# Patient Record
Sex: Male | Born: 1940
Health system: Southern US, Community
[De-identification: ages and names within clinical notes are randomized; demographics above are authoritative.]

## PROBLEM LIST (undated history)

## (undated) DIAGNOSIS — D649 Anemia, unspecified: Secondary | ICD-10-CM

## (undated) DIAGNOSIS — N289 Disorder of kidney and ureter, unspecified: Secondary | ICD-10-CM

## (undated) DIAGNOSIS — I2510 Atherosclerotic heart disease of native coronary artery without angina pectoris: Secondary | ICD-10-CM

## (undated) DIAGNOSIS — M109 Gout, unspecified: Secondary | ICD-10-CM

## (undated) DIAGNOSIS — I359 Nonrheumatic aortic valve disorder, unspecified: Secondary | ICD-10-CM

## (undated) DIAGNOSIS — T884XXA Failed or difficult intubation, initial encounter: Secondary | ICD-10-CM

## (undated) DIAGNOSIS — I442 Atrioventricular block, complete: Secondary | ICD-10-CM

## (undated) DIAGNOSIS — E785 Hyperlipidemia, unspecified: Secondary | ICD-10-CM

## (undated) DIAGNOSIS — I10 Essential (primary) hypertension: Secondary | ICD-10-CM

## (undated) DIAGNOSIS — D1800 Hemangioma unspecified site: Secondary | ICD-10-CM

## (undated) DIAGNOSIS — N179 Acute kidney failure, unspecified: Secondary | ICD-10-CM

## (undated) DIAGNOSIS — E119 Type 2 diabetes mellitus without complications: Secondary | ICD-10-CM

## (undated) DIAGNOSIS — I350 Nonrheumatic aortic (valve) stenosis: Secondary | ICD-10-CM

## (undated) DIAGNOSIS — N189 Chronic kidney disease, unspecified: Secondary | ICD-10-CM

## (undated) DIAGNOSIS — H409 Unspecified glaucoma: Secondary | ICD-10-CM

## (undated) DIAGNOSIS — I509 Heart failure, unspecified: Secondary | ICD-10-CM

## (undated) HISTORY — DX: Acute kidney failure, unspecified: N17.9

## (undated) HISTORY — DX: Nonrheumatic aortic (valve) stenosis: I35.0

## (undated) HISTORY — DX: Essential (primary) hypertension: I10

## (undated) HISTORY — DX: Anemia, unspecified: D64.9

## (undated) HISTORY — DX: Type 2 diabetes mellitus without complications: E11.9

## (undated) HISTORY — DX: Unspecified glaucoma: H40.9

## (undated) HISTORY — DX: Atherosclerotic heart disease of native coronary artery without angina pectoris: I25.10

## (undated) HISTORY — DX: Atrioventricular block, complete: I44.2

## (undated) HISTORY — DX: Heart failure, unspecified: I50.9

## (undated) HISTORY — DX: Nonrheumatic aortic valve disorder, unspecified: I35.9

## (undated) HISTORY — DX: Chronic kidney disease, unspecified: N18.9

## (undated) HISTORY — PX: TOTAL HIP ARTHROPLASTY: SHX124

## (undated) HISTORY — DX: Hyperlipidemia, unspecified: E78.5

## (undated) HISTORY — DX: Hemangioma unspecified site: D18.00

## (undated) HISTORY — DX: Disorder of kidney and ureter, unspecified: N28.9

## (undated) HISTORY — DX: Gout, unspecified: M10.9

## (undated) HISTORY — DX: Failed or difficult intubation, initial encounter: T88.4XXA

---

## 2007-01-11 ENCOUNTER — Encounter: Admission: RE | Admit: 2007-01-11 | Discharge: 2007-01-11 | Payer: Self-pay | Admitting: Pulmonary Disease

## 2008-11-11 ENCOUNTER — Ambulatory Visit (HOSPITAL_COMMUNITY): Admission: RE | Admit: 2008-11-11 | Discharge: 2008-11-11 | Payer: Self-pay | Admitting: Pulmonary Disease

## 2009-10-07 HISTORY — PX: COLONOSCOPY: SHX174

## 2012-01-09 DIAGNOSIS — I119 Hypertensive heart disease without heart failure: Secondary | ICD-10-CM | POA: Diagnosis not present

## 2012-01-09 DIAGNOSIS — E7800 Pure hypercholesterolemia, unspecified: Secondary | ICD-10-CM | POA: Diagnosis not present

## 2012-01-09 DIAGNOSIS — E119 Type 2 diabetes mellitus without complications: Secondary | ICD-10-CM | POA: Diagnosis not present

## 2012-01-09 DIAGNOSIS — M2240 Chondromalacia patellae, unspecified knee: Secondary | ICD-10-CM | POA: Diagnosis not present

## 2012-01-09 DIAGNOSIS — Z79899 Other long term (current) drug therapy: Secondary | ICD-10-CM | POA: Diagnosis not present

## 2012-03-04 ENCOUNTER — Ambulatory Visit (HOSPITAL_COMMUNITY)
Admission: RE | Admit: 2012-03-04 | Discharge: 2012-03-04 | Disposition: A | Discharge status: Home / Self Care | Payer: Medicare Other | Source: Ambulatory Visit | Attending: Pulmonary Disease | Admitting: Pulmonary Disease

## 2012-03-04 ENCOUNTER — Other Ambulatory Visit: Payer: Self-pay | Admitting: Pulmonary Disease

## 2012-03-04 DIAGNOSIS — R05 Cough: Secondary | ICD-10-CM | POA: Insufficient documentation

## 2012-03-04 DIAGNOSIS — R0602 Shortness of breath: Secondary | ICD-10-CM | POA: Diagnosis not present

## 2012-03-04 DIAGNOSIS — R059 Cough, unspecified: Secondary | ICD-10-CM | POA: Insufficient documentation

## 2012-03-04 DIAGNOSIS — R079 Chest pain, unspecified: Secondary | ICD-10-CM | POA: Diagnosis not present

## 2012-03-05 DIAGNOSIS — I119 Hypertensive heart disease without heart failure: Secondary | ICD-10-CM | POA: Diagnosis not present

## 2012-03-05 DIAGNOSIS — J209 Acute bronchitis, unspecified: Secondary | ICD-10-CM | POA: Diagnosis not present

## 2012-03-05 DIAGNOSIS — E119 Type 2 diabetes mellitus without complications: Secondary | ICD-10-CM | POA: Diagnosis not present

## 2012-06-02 DIAGNOSIS — Z79899 Other long term (current) drug therapy: Secondary | ICD-10-CM | POA: Diagnosis not present

## 2012-06-02 DIAGNOSIS — E7800 Pure hypercholesterolemia, unspecified: Secondary | ICD-10-CM | POA: Diagnosis not present

## 2012-06-02 DIAGNOSIS — I119 Hypertensive heart disease without heart failure: Secondary | ICD-10-CM | POA: Diagnosis not present

## 2012-06-02 DIAGNOSIS — M79609 Pain in unspecified limb: Secondary | ICD-10-CM | POA: Diagnosis not present

## 2012-06-02 DIAGNOSIS — E119 Type 2 diabetes mellitus without complications: Secondary | ICD-10-CM | POA: Diagnosis not present

## 2012-06-02 DIAGNOSIS — Z96649 Presence of unspecified artificial hip joint: Secondary | ICD-10-CM | POA: Diagnosis not present

## 2012-08-24 ENCOUNTER — Other Ambulatory Visit: Payer: Self-pay | Admitting: Orthopedic Surgery

## 2012-08-24 ENCOUNTER — Ambulatory Visit
Admission: RE | Admit: 2012-08-24 | Discharge: 2012-08-24 | Disposition: A | Discharge status: Home / Self Care | Payer: Federal, State, Local not specified - PPO | Source: Ambulatory Visit | Attending: Orthopedic Surgery | Admitting: Orthopedic Surgery

## 2012-08-24 ENCOUNTER — Ambulatory Visit
Admission: RE | Admit: 2012-08-24 | Discharge: 2012-08-24 | Disposition: A | Discharge status: Home / Self Care | Payer: Medicare Other | Source: Ambulatory Visit | Attending: Orthopedic Surgery | Admitting: Orthopedic Surgery

## 2012-08-24 DIAGNOSIS — M25559 Pain in unspecified hip: Secondary | ICD-10-CM | POA: Diagnosis not present

## 2012-08-24 DIAGNOSIS — M25552 Pain in left hip: Secondary | ICD-10-CM

## 2012-08-24 DIAGNOSIS — T84498A Other mechanical complication of other internal orthopedic devices, implants and grafts, initial encounter: Secondary | ICD-10-CM | POA: Diagnosis not present

## 2012-08-31 DIAGNOSIS — T84498A Other mechanical complication of other internal orthopedic devices, implants and grafts, initial encounter: Secondary | ICD-10-CM | POA: Diagnosis not present

## 2013-01-12 DIAGNOSIS — M109 Gout, unspecified: Secondary | ICD-10-CM | POA: Diagnosis not present

## 2013-01-12 DIAGNOSIS — Z79899 Other long term (current) drug therapy: Secondary | ICD-10-CM | POA: Diagnosis not present

## 2013-01-12 DIAGNOSIS — I119 Hypertensive heart disease without heart failure: Secondary | ICD-10-CM | POA: Diagnosis not present

## 2013-01-12 DIAGNOSIS — E7800 Pure hypercholesterolemia, unspecified: Secondary | ICD-10-CM | POA: Diagnosis not present

## 2013-02-18 DIAGNOSIS — M1990 Unspecified osteoarthritis, unspecified site: Secondary | ICD-10-CM | POA: Diagnosis not present

## 2013-02-18 DIAGNOSIS — I119 Hypertensive heart disease without heart failure: Secondary | ICD-10-CM | POA: Diagnosis not present

## 2013-02-18 DIAGNOSIS — M109 Gout, unspecified: Secondary | ICD-10-CM | POA: Diagnosis not present

## 2013-02-18 DIAGNOSIS — Z79899 Other long term (current) drug therapy: Secondary | ICD-10-CM | POA: Diagnosis not present

## 2013-02-18 DIAGNOSIS — E7800 Pure hypercholesterolemia, unspecified: Secondary | ICD-10-CM | POA: Diagnosis not present

## 2013-03-08 DIAGNOSIS — R21 Rash and other nonspecific skin eruption: Secondary | ICD-10-CM | POA: Diagnosis not present

## 2013-03-08 DIAGNOSIS — IMO0001 Reserved for inherently not codable concepts without codable children: Secondary | ICD-10-CM | POA: Diagnosis not present

## 2013-07-02 DIAGNOSIS — IMO0001 Reserved for inherently not codable concepts without codable children: Secondary | ICD-10-CM | POA: Diagnosis not present

## 2013-07-02 DIAGNOSIS — Z471 Aftercare following joint replacement surgery: Secondary | ICD-10-CM | POA: Diagnosis not present

## 2013-07-05 DIAGNOSIS — Z471 Aftercare following joint replacement surgery: Secondary | ICD-10-CM | POA: Diagnosis not present

## 2013-07-05 DIAGNOSIS — IMO0001 Reserved for inherently not codable concepts without codable children: Secondary | ICD-10-CM | POA: Diagnosis not present

## 2013-07-07 DIAGNOSIS — IMO0001 Reserved for inherently not codable concepts without codable children: Secondary | ICD-10-CM | POA: Diagnosis not present

## 2013-07-07 DIAGNOSIS — Z471 Aftercare following joint replacement surgery: Secondary | ICD-10-CM | POA: Diagnosis not present

## 2013-07-09 DIAGNOSIS — IMO0001 Reserved for inherently not codable concepts without codable children: Secondary | ICD-10-CM | POA: Diagnosis not present

## 2013-07-09 DIAGNOSIS — Z471 Aftercare following joint replacement surgery: Secondary | ICD-10-CM | POA: Diagnosis not present

## 2013-07-16 DIAGNOSIS — Z471 Aftercare following joint replacement surgery: Secondary | ICD-10-CM | POA: Diagnosis not present

## 2013-07-16 DIAGNOSIS — IMO0001 Reserved for inherently not codable concepts without codable children: Secondary | ICD-10-CM | POA: Diagnosis not present

## 2013-07-17 DIAGNOSIS — Z471 Aftercare following joint replacement surgery: Secondary | ICD-10-CM | POA: Diagnosis not present

## 2013-07-17 DIAGNOSIS — IMO0001 Reserved for inherently not codable concepts without codable children: Secondary | ICD-10-CM | POA: Diagnosis not present

## 2013-07-19 DIAGNOSIS — Z471 Aftercare following joint replacement surgery: Secondary | ICD-10-CM | POA: Diagnosis not present

## 2013-07-19 DIAGNOSIS — IMO0001 Reserved for inherently not codable concepts without codable children: Secondary | ICD-10-CM | POA: Diagnosis not present

## 2013-07-22 DIAGNOSIS — Z471 Aftercare following joint replacement surgery: Secondary | ICD-10-CM | POA: Diagnosis not present

## 2013-07-22 DIAGNOSIS — IMO0001 Reserved for inherently not codable concepts without codable children: Secondary | ICD-10-CM | POA: Diagnosis not present

## 2013-07-23 DIAGNOSIS — IMO0001 Reserved for inherently not codable concepts without codable children: Secondary | ICD-10-CM | POA: Diagnosis not present

## 2013-07-23 DIAGNOSIS — Z471 Aftercare following joint replacement surgery: Secondary | ICD-10-CM | POA: Diagnosis not present

## 2013-08-14 DIAGNOSIS — R609 Edema, unspecified: Secondary | ICD-10-CM | POA: Diagnosis not present

## 2013-08-14 DIAGNOSIS — Z9889 Other specified postprocedural states: Secondary | ICD-10-CM | POA: Diagnosis not present

## 2013-08-24 DIAGNOSIS — M1990 Unspecified osteoarthritis, unspecified site: Secondary | ICD-10-CM | POA: Diagnosis not present

## 2013-08-24 DIAGNOSIS — R609 Edema, unspecified: Secondary | ICD-10-CM | POA: Diagnosis not present

## 2013-08-24 DIAGNOSIS — M109 Gout, unspecified: Secondary | ICD-10-CM | POA: Diagnosis not present

## 2013-08-24 DIAGNOSIS — E7800 Pure hypercholesterolemia, unspecified: Secondary | ICD-10-CM | POA: Diagnosis not present

## 2013-08-24 DIAGNOSIS — Z79899 Other long term (current) drug therapy: Secondary | ICD-10-CM | POA: Diagnosis not present

## 2013-08-24 DIAGNOSIS — E1129 Type 2 diabetes mellitus with other diabetic kidney complication: Secondary | ICD-10-CM | POA: Diagnosis not present

## 2013-09-11 IMAGING — CR DG HIP (WITH OR WITHOUT PELVIS) 2-3V*L*
2 series · 2 of 2 positions shown · non-contrast
Comparison: None.

CLINICAL DATA: Previous hip arthroplasty, pain.

LEFT HIP - COMPLETE 2+ VIEW

[view not recorded (1 of 2)]
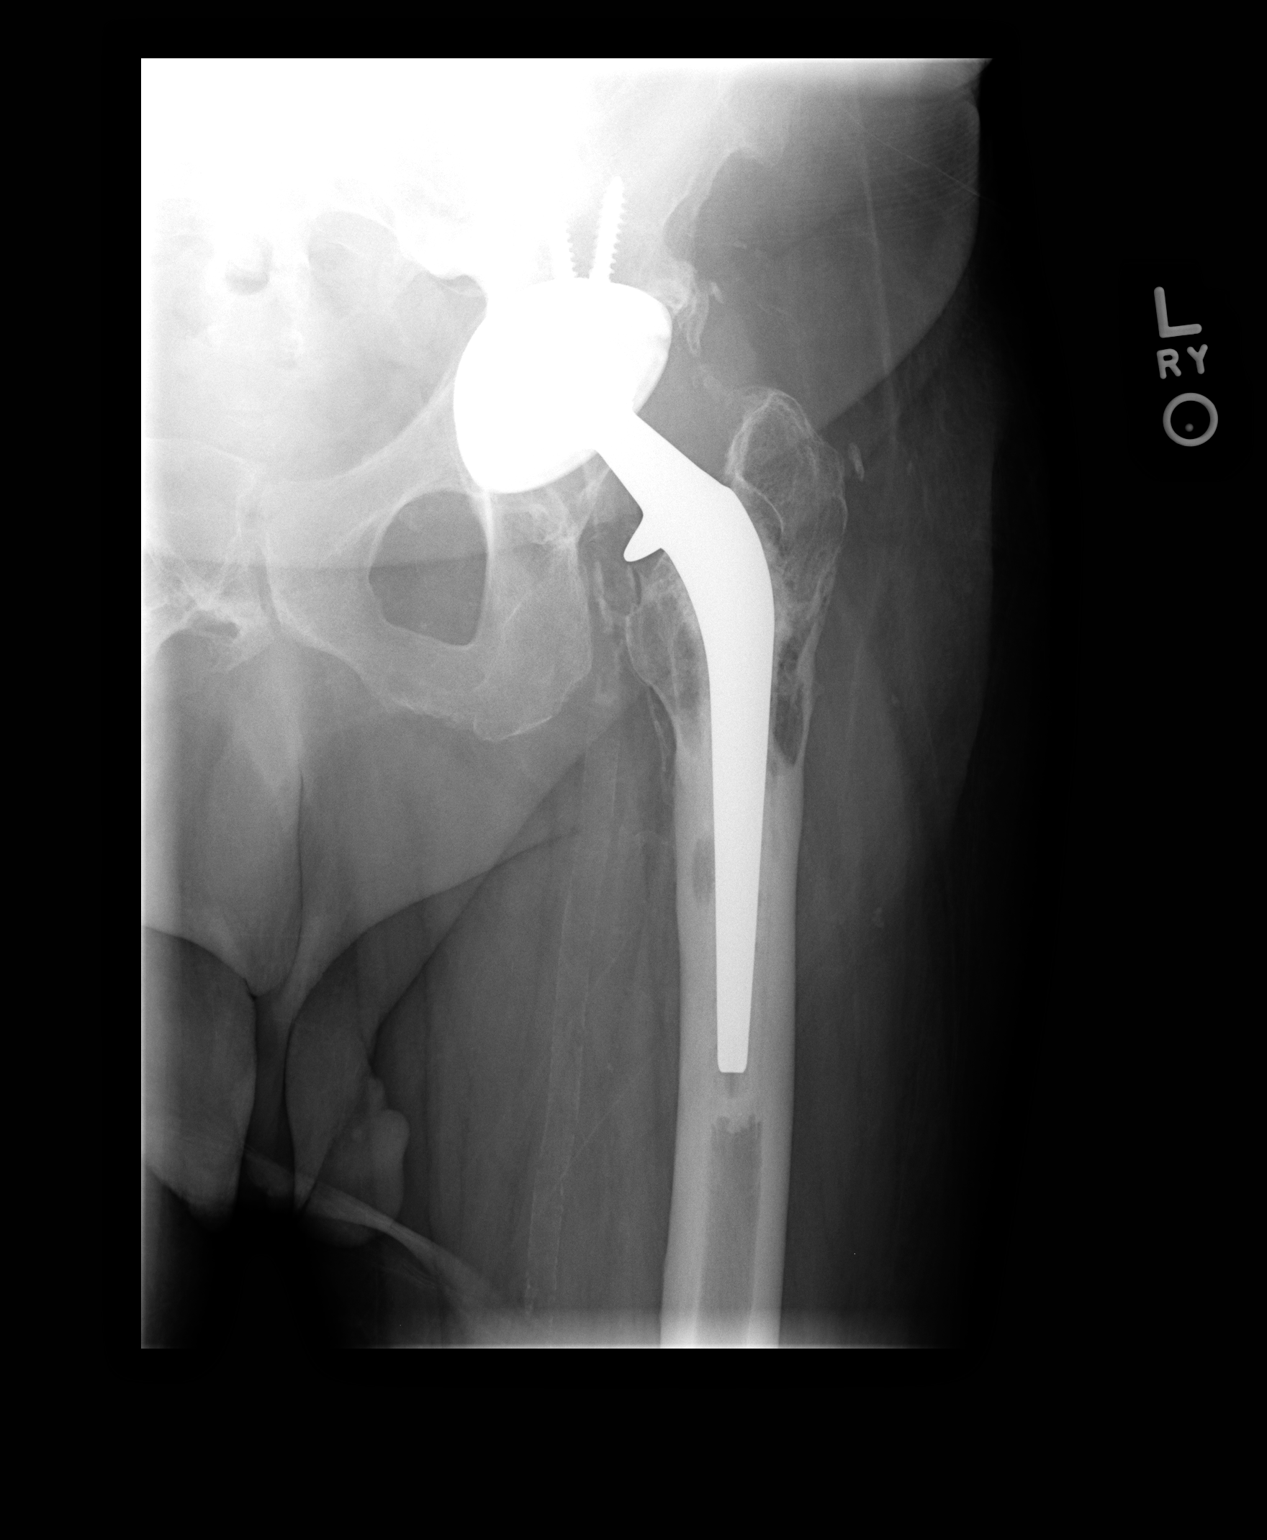

[view not recorded (2 of 2)]
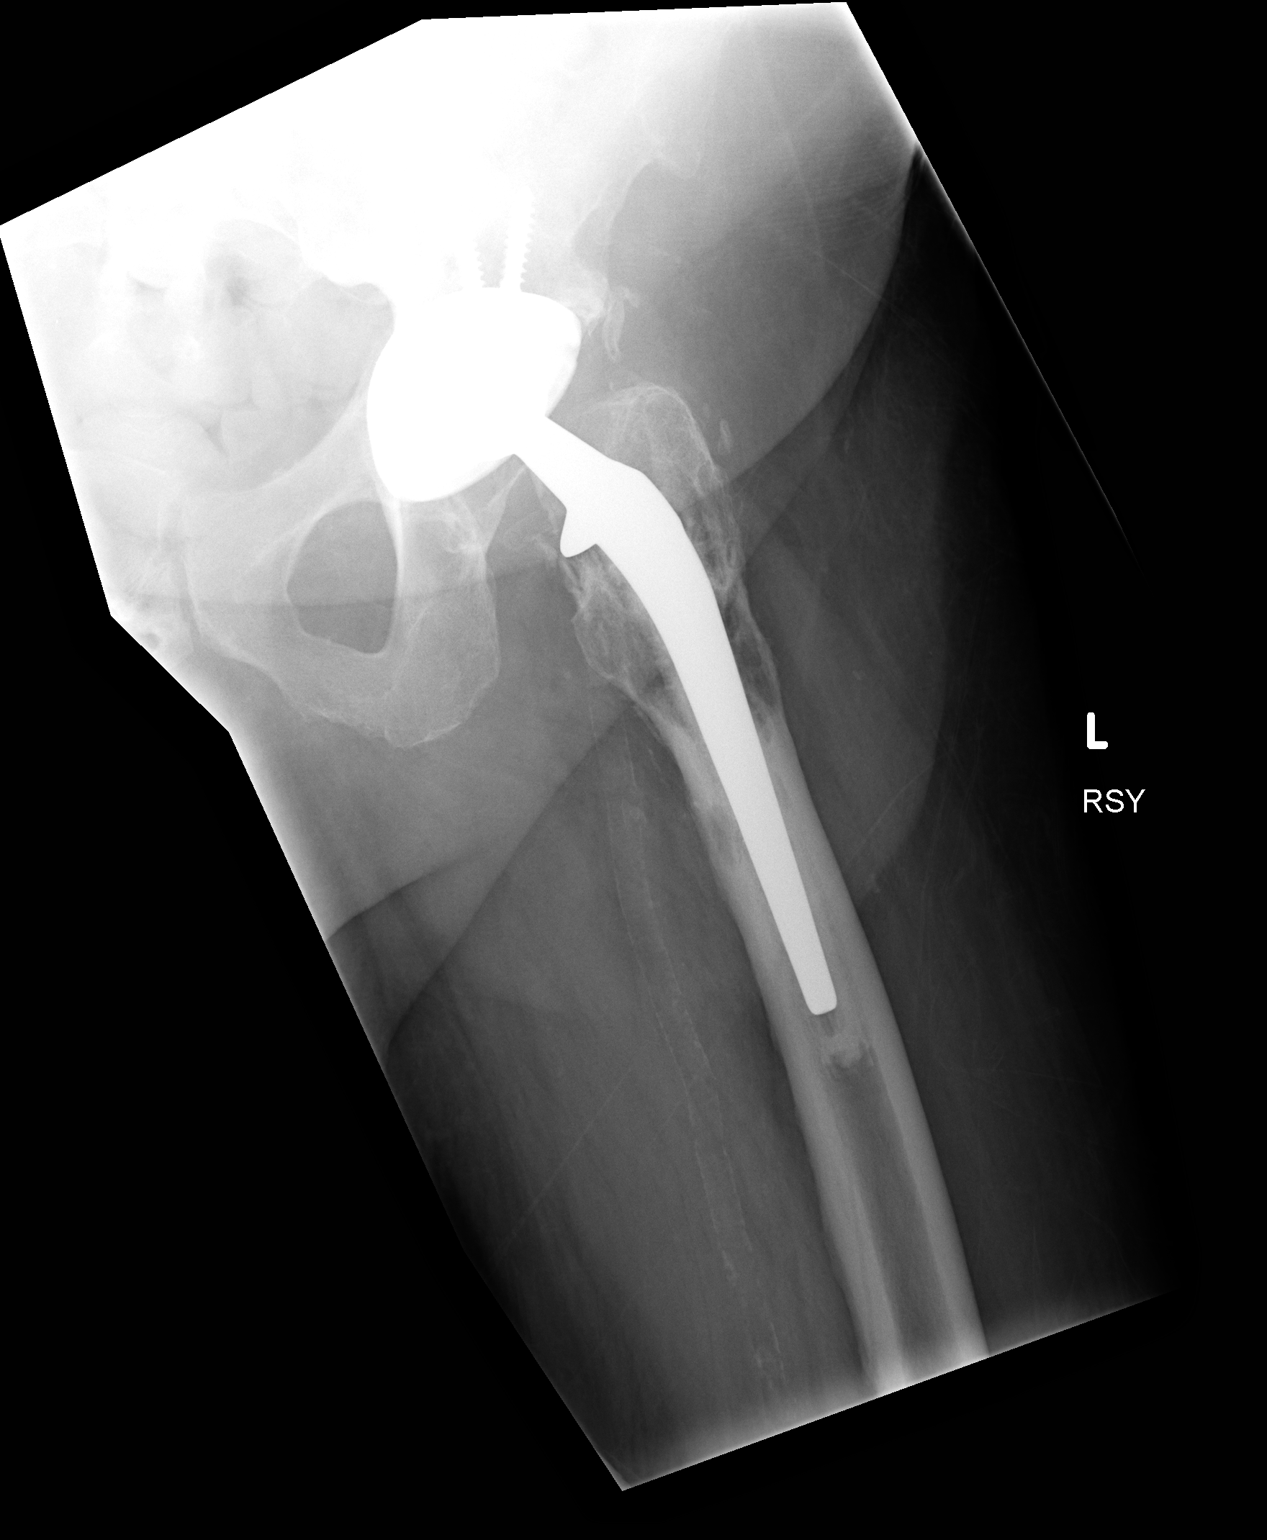

[2 of 2 positions shown; findings below may reference images not displayed]

FINDINGS: There are patchy fairly sharply demarcated areas of
lucency around the femoral component of hip arthroplasty most
marked at the  trochanters and intertrochanteric region, but also
seen in the proximal diaphysis.  There is mild protrusio acetabula
of the acetabular component.  Negative for fracture or dislocation.
Femoral arterial calcifications noted.
IMPRESSION: 1.  Scattered lucency around the femoral component of left hip
arthroplasty suggesting particle disease.  Correlate clinically.

## 2013-09-11 IMAGING — CR DG PELVIS 1-2V
1 series · 1 of 1 positions shown · non-contrast
Comparison: None.

CLINICAL DATA: Pain and weakness

PELVIS - 1-2 VIEW

[view not recorded]
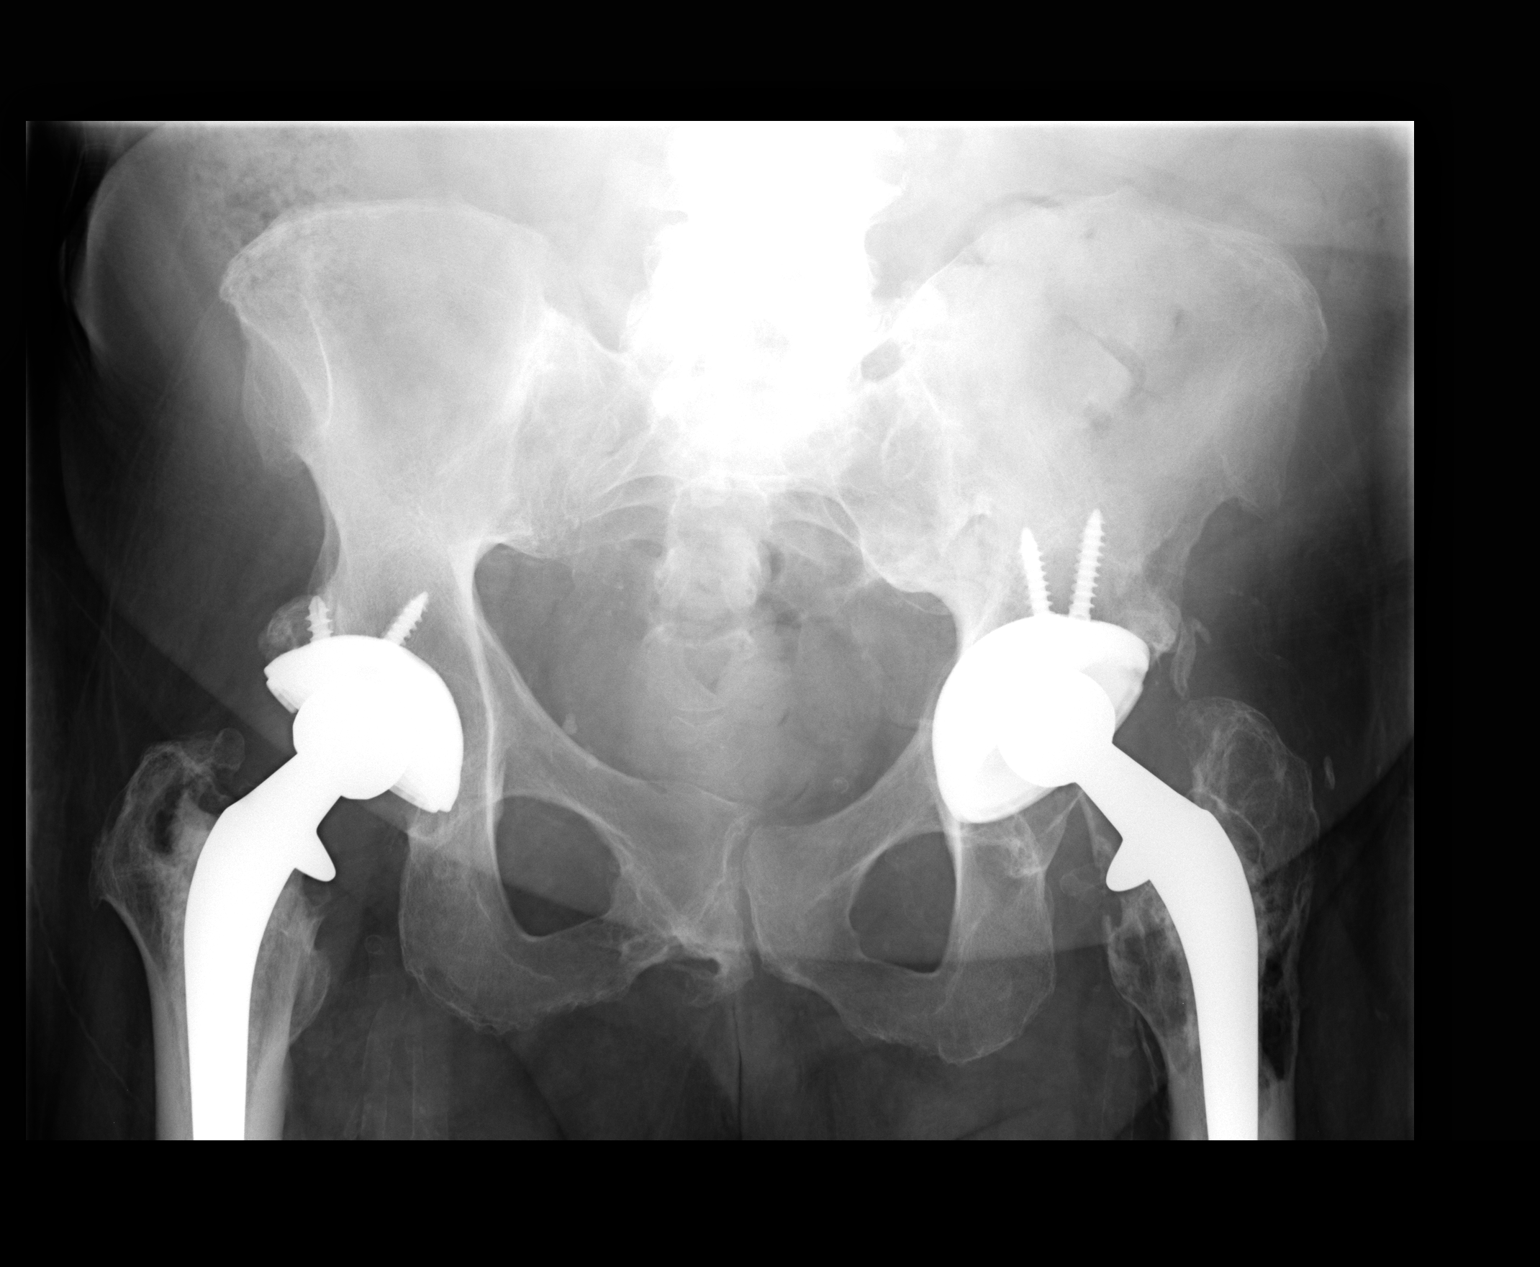

[1 of 1 positions shown; findings below may reference images not displayed]

FINDINGS: Previous bilateral hip arthroplasty.  Distal aspect of
femoral components not visualized.  There are patchy fairly sharply
demarcated lucencies in the proximal left femoral diaphysis and at
the left lesser trochanter, as well as rarefaction of the left
greater trochanter.  There is a single focal area of lucency noted
in the right greater trochanter.  There is protrusio acetabula on
the left.  Question old fracture deformity of the right pubic bone.
No acute fracture or dislocation.
IMPRESSION: 1. Patchy lucency around the femoral components of hip arthroplasty
hardware,   more marked on the left than right, suggesting particle
disease.  Correlate clinically.
2.  Negative for fracture, dislocation, or other acute abnormality.

## 2014-02-15 DIAGNOSIS — Z79899 Other long term (current) drug therapy: Secondary | ICD-10-CM | POA: Diagnosis not present

## 2014-02-15 DIAGNOSIS — E7800 Pure hypercholesterolemia, unspecified: Secondary | ICD-10-CM | POA: Diagnosis not present

## 2014-02-15 DIAGNOSIS — E1129 Type 2 diabetes mellitus with other diabetic kidney complication: Secondary | ICD-10-CM | POA: Diagnosis not present

## 2014-02-15 DIAGNOSIS — R609 Edema, unspecified: Secondary | ICD-10-CM | POA: Diagnosis not present

## 2014-02-15 DIAGNOSIS — E1165 Type 2 diabetes mellitus with hyperglycemia: Secondary | ICD-10-CM | POA: Diagnosis not present

## 2014-02-15 DIAGNOSIS — L851 Acquired keratosis [keratoderma] palmaris et plantaris: Secondary | ICD-10-CM | POA: Diagnosis not present

## 2014-02-15 DIAGNOSIS — M109 Gout, unspecified: Secondary | ICD-10-CM | POA: Diagnosis not present

## 2014-02-15 DIAGNOSIS — E1149 Type 2 diabetes mellitus with other diabetic neurological complication: Secondary | ICD-10-CM | POA: Diagnosis not present

## 2014-05-17 DIAGNOSIS — M109 Gout, unspecified: Secondary | ICD-10-CM | POA: Diagnosis not present

## 2014-05-17 DIAGNOSIS — I119 Hypertensive heart disease without heart failure: Secondary | ICD-10-CM | POA: Diagnosis not present

## 2014-05-17 DIAGNOSIS — E119 Type 2 diabetes mellitus without complications: Secondary | ICD-10-CM | POA: Diagnosis not present

## 2014-05-17 DIAGNOSIS — Z79899 Other long term (current) drug therapy: Secondary | ICD-10-CM | POA: Diagnosis not present

## 2014-08-25 DIAGNOSIS — E119 Type 2 diabetes mellitus without complications: Secondary | ICD-10-CM | POA: Diagnosis not present

## 2014-08-25 DIAGNOSIS — Z79899 Other long term (current) drug therapy: Secondary | ICD-10-CM | POA: Diagnosis not present

## 2014-08-25 DIAGNOSIS — J209 Acute bronchitis, unspecified: Secondary | ICD-10-CM | POA: Diagnosis not present

## 2014-08-25 DIAGNOSIS — I119 Hypertensive heart disease without heart failure: Secondary | ICD-10-CM | POA: Diagnosis not present

## 2015-01-03 DIAGNOSIS — E1129 Type 2 diabetes mellitus with other diabetic kidney complication: Secondary | ICD-10-CM | POA: Diagnosis not present

## 2015-01-03 DIAGNOSIS — Z79899 Other long term (current) drug therapy: Secondary | ICD-10-CM | POA: Diagnosis not present

## 2015-01-03 DIAGNOSIS — E119 Type 2 diabetes mellitus without complications: Secondary | ICD-10-CM | POA: Diagnosis not present

## 2015-01-03 DIAGNOSIS — Z125 Encounter for screening for malignant neoplasm of prostate: Secondary | ICD-10-CM | POA: Diagnosis not present

## 2015-01-03 DIAGNOSIS — E780 Pure hypercholesterolemia: Secondary | ICD-10-CM | POA: Diagnosis not present

## 2015-01-03 DIAGNOSIS — I119 Hypertensive heart disease without heart failure: Secondary | ICD-10-CM | POA: Diagnosis not present

## 2015-01-03 DIAGNOSIS — N183 Chronic kidney disease, stage 3 (moderate): Secondary | ICD-10-CM | POA: Diagnosis not present

## 2015-06-08 ENCOUNTER — Other Ambulatory Visit (HOSPITAL_COMMUNITY): Payer: Self-pay | Admitting: Pulmonary Disease

## 2015-06-08 ENCOUNTER — Ambulatory Visit (HOSPITAL_COMMUNITY)
Admission: RE | Admit: 2015-06-08 | Discharge: 2015-06-08 | Disposition: A | Discharge status: Home / Self Care | Payer: Medicare Other | Source: Ambulatory Visit | Attending: Pulmonary Disease | Admitting: Pulmonary Disease

## 2015-06-08 DIAGNOSIS — I6529 Occlusion and stenosis of unspecified carotid artery: Secondary | ICD-10-CM | POA: Diagnosis not present

## 2015-06-08 DIAGNOSIS — M542 Cervicalgia: Secondary | ICD-10-CM

## 2015-06-08 DIAGNOSIS — M47812 Spondylosis without myelopathy or radiculopathy, cervical region: Secondary | ICD-10-CM | POA: Diagnosis not present

## 2015-06-08 DIAGNOSIS — E1121 Type 2 diabetes mellitus with diabetic nephropathy: Secondary | ICD-10-CM | POA: Diagnosis not present

## 2015-06-08 DIAGNOSIS — L570 Actinic keratosis: Secondary | ICD-10-CM | POA: Diagnosis not present

## 2015-06-08 DIAGNOSIS — M47892 Other spondylosis, cervical region: Secondary | ICD-10-CM | POA: Diagnosis not present

## 2015-06-08 DIAGNOSIS — Z0000 Encounter for general adult medical examination without abnormal findings: Secondary | ICD-10-CM

## 2015-06-08 DIAGNOSIS — I119 Hypertensive heart disease without heart failure: Secondary | ICD-10-CM | POA: Diagnosis not present

## 2015-06-08 DIAGNOSIS — M5030 Other cervical disc degeneration, unspecified cervical region: Secondary | ICD-10-CM | POA: Diagnosis not present

## 2015-06-08 DIAGNOSIS — E780 Pure hypercholesterolemia: Secondary | ICD-10-CM | POA: Diagnosis not present

## 2015-06-08 DIAGNOSIS — N183 Chronic kidney disease, stage 3 (moderate): Secondary | ICD-10-CM | POA: Diagnosis not present

## 2015-06-13 DIAGNOSIS — D2239 Melanocytic nevi of other parts of face: Secondary | ICD-10-CM | POA: Diagnosis not present

## 2015-06-13 DIAGNOSIS — L821 Other seborrheic keratosis: Secondary | ICD-10-CM | POA: Diagnosis not present

## 2015-06-13 DIAGNOSIS — D485 Neoplasm of uncertain behavior of skin: Secondary | ICD-10-CM | POA: Diagnosis not present

## 2015-06-13 DIAGNOSIS — D2262 Melanocytic nevi of left upper limb, including shoulder: Secondary | ICD-10-CM | POA: Diagnosis not present

## 2015-06-13 DIAGNOSIS — D2230 Melanocytic nevi of unspecified part of face: Secondary | ICD-10-CM | POA: Diagnosis not present

## 2015-06-15 DIAGNOSIS — L570 Actinic keratosis: Secondary | ICD-10-CM | POA: Diagnosis not present

## 2015-06-15 DIAGNOSIS — D485 Neoplasm of uncertain behavior of skin: Secondary | ICD-10-CM | POA: Diagnosis not present

## 2015-09-11 DIAGNOSIS — E1121 Type 2 diabetes mellitus with diabetic nephropathy: Secondary | ICD-10-CM | POA: Diagnosis not present

## 2015-09-11 DIAGNOSIS — N183 Chronic kidney disease, stage 3 (moderate): Secondary | ICD-10-CM | POA: Diagnosis not present

## 2015-09-11 DIAGNOSIS — Z96649 Presence of unspecified artificial hip joint: Secondary | ICD-10-CM | POA: Diagnosis not present

## 2015-09-11 DIAGNOSIS — M5030 Other cervical disc degeneration, unspecified cervical region: Secondary | ICD-10-CM | POA: Diagnosis not present

## 2015-09-11 DIAGNOSIS — M159 Polyosteoarthritis, unspecified: Secondary | ICD-10-CM | POA: Diagnosis not present

## 2015-09-11 DIAGNOSIS — E1129 Type 2 diabetes mellitus with other diabetic kidney complication: Secondary | ICD-10-CM | POA: Diagnosis not present

## 2015-09-11 DIAGNOSIS — I119 Hypertensive heart disease without heart failure: Secondary | ICD-10-CM | POA: Diagnosis not present

## 2015-09-11 DIAGNOSIS — M79671 Pain in right foot: Secondary | ICD-10-CM | POA: Diagnosis not present

## 2015-09-11 DIAGNOSIS — E7800 Pure hypercholesterolemia, unspecified: Secondary | ICD-10-CM | POA: Diagnosis not present

## 2015-09-11 DIAGNOSIS — Z6831 Body mass index (BMI) 31.0-31.9, adult: Secondary | ICD-10-CM | POA: Diagnosis not present

## 2015-09-11 DIAGNOSIS — Z79899 Other long term (current) drug therapy: Secondary | ICD-10-CM | POA: Diagnosis not present

## 2015-09-11 DIAGNOSIS — M1000 Idiopathic gout, unspecified site: Secondary | ICD-10-CM | POA: Diagnosis not present

## 2015-09-12 ENCOUNTER — Ambulatory Visit (INDEPENDENT_AMBULATORY_CARE_PROVIDER_SITE_OTHER): Payer: Medicare Other | Admitting: Podiatry

## 2015-09-12 ENCOUNTER — Encounter: Payer: Self-pay | Admitting: Podiatry

## 2015-09-12 VITALS — Ht 67.0 in | Wt 200.0 lb

## 2015-09-12 DIAGNOSIS — M216X2 Other acquired deformities of left foot: Secondary | ICD-10-CM

## 2015-09-12 DIAGNOSIS — M216X9 Other acquired deformities of unspecified foot: Secondary | ICD-10-CM | POA: Diagnosis not present

## 2015-09-12 DIAGNOSIS — M7741 Metatarsalgia, right foot: Secondary | ICD-10-CM

## 2015-09-12 DIAGNOSIS — M7740 Metatarsalgia, unspecified foot: Secondary | ICD-10-CM | POA: Diagnosis not present

## 2015-09-12 DIAGNOSIS — M216X1 Other acquired deformities of right foot: Secondary | ICD-10-CM

## 2015-09-12 DIAGNOSIS — M21969 Unspecified acquired deformity of unspecified lower leg: Secondary | ICD-10-CM | POA: Diagnosis not present

## 2015-09-12 NOTE — Progress Notes (Signed)
Subjective: 74 year old male presents complaining of right foot pain for 5 months to stand or walk since July 2016. Patient points distal 1/2 and more on lateral column.  Stated that he was treated at [facility] and examined with x-ray. His symptoms attributed to arthritis. Patient also states that he lost 40% kidney function due to taking NSAIA from [place].  Been diabetic since age 10, and blood sugar is under control average 80--90 in the morning and 140-150 after a meal.   Review of systems are normal other than getting Glaucoma and Kidney going bad from prolonged NSAIA.  Objective: Dermatologic: Normal skin with deformed nail left great toe. Vascular: Left foot pedal pulses not palpable. Right foot faintly palpable.  Neurologic: All epicritic and tactile sensations grossly intact.  Orthopedic: Excess sagittal plane motion of the first ray bilateral. Tight Achilles tendon bilateral.  Assessment: Ankle equinus bilateral. Excess sagittal plane motion first ray bilateral. PVD left foot. Lesser metatarsalgia from lateral weight shifting right foot.  Plan: Reviewed stretch exercise and benefit of custom or OTC hard orthotics. Return in one month.

## 2015-09-12 NOTE — Patient Instructions (Signed)
Seen for pain in right foot. Noted of tight Achilles tendon and weakened first Metatarsal bone. Need to do daily stretch exercise for tight tendon. Also need to wear Custom or OTC orthotics. Return in one month.

## 2015-10-12 ENCOUNTER — Ambulatory Visit (INDEPENDENT_AMBULATORY_CARE_PROVIDER_SITE_OTHER): Payer: Medicare Other | Admitting: Podiatry

## 2015-10-12 ENCOUNTER — Encounter: Payer: Self-pay | Admitting: Podiatry

## 2015-10-12 VITALS — BP 145/63 | HR 72

## 2015-10-12 DIAGNOSIS — M79606 Pain in leg, unspecified: Secondary | ICD-10-CM | POA: Insufficient documentation

## 2015-10-12 DIAGNOSIS — M7741 Metatarsalgia, right foot: Secondary | ICD-10-CM

## 2015-10-12 DIAGNOSIS — B351 Tinea unguium: Secondary | ICD-10-CM | POA: Diagnosis not present

## 2015-10-12 DIAGNOSIS — M79604 Pain in right leg: Secondary | ICD-10-CM

## 2015-10-12 DIAGNOSIS — M216X1 Other acquired deformities of right foot: Secondary | ICD-10-CM

## 2015-10-12 NOTE — Patient Instructions (Signed)
Follow up on right foot pain. Doing better with stretch exercise and shoe inserts. Nails debrided. Heel lateral posting added to shoe insert. Continue with compression socks. Return in 3 months.

## 2015-10-12 NOTE — Progress Notes (Signed)
Subjective: 75 year old male presents for follow up on right foot pain.  Pain is little better. Been stretching and wearing inserts that were taken out from his [employer] shoes.  Podiatric History: Right foot lateral column pain has been going on since July 2016. Patient points distal 1/2 and more on lateral column.  Stated that he was treated at [facility] and examined with x-ray. His symptoms attributed to arthritis. Patient also states that he lost 40% kidney function due to taking NSAIA from [place].  Been diabetic since age 36, and blood sugar is under control average 80--90 in the morning and 140-150 after a meal.   Objective: Dermatologic: Normal skin with deformed nail left great toe. Vascular: Left foot pedal pulses not palpable. Right foot faintly palpable.  Neurologic: All epicritic and tactile sensations grossly intact.  Orthopedic: Excess sagittal plane motion of the first ray bilateral. Tight Achilles tendon bilateral.  Assessment: Ankle equinus bilateral. Excess sagittal plane motion first ray bilateral. PVD left foot. Lesser metatarsalgia from lateral weight shifting right foot.  Plan: Continue with stretch exercise. All nails debrided. Lateral posting placed in both heel inserts. Return in 3 months for RFC.

## 2016-01-10 ENCOUNTER — Ambulatory Visit (INDEPENDENT_AMBULATORY_CARE_PROVIDER_SITE_OTHER): Payer: Medicare Other | Admitting: Podiatry

## 2016-01-10 ENCOUNTER — Encounter: Payer: Self-pay | Admitting: Podiatry

## 2016-01-10 VITALS — BP 160/66 | HR 62

## 2016-01-10 DIAGNOSIS — M21969 Unspecified acquired deformity of unspecified lower leg: Secondary | ICD-10-CM

## 2016-01-10 DIAGNOSIS — M7741 Metatarsalgia, right foot: Secondary | ICD-10-CM

## 2016-01-10 DIAGNOSIS — M216X1 Other acquired deformities of right foot: Secondary | ICD-10-CM | POA: Diagnosis not present

## 2016-01-10 NOTE — Patient Instructions (Signed)
Diabetic foot check. Did well with nails. Continue with stretch exercise. Return in 3 months or sooner if needed.

## 2016-01-10 NOTE — Progress Notes (Signed)
Subjective: 75 year old male presents for follow up on right foot pain.  Foot pain is much better after wearing shoe inserts and doing stretch exercise. Had to cut some nails off at home because they were bothering him. He wants them to be smoothened out.  Blood sugar this morning was 90.  Podiatric History: Right foot lateral column pain has been going on since July 2016. This is much better now since the stretch exericse and shoe inserts.  He indicated during previous visit that he has Kidney condition that he lost 40% kidney function due to taking NSAIA from [place].  Been diabetic since age 53, and blood sugar still remains under control average 80--90.  Objective: Dermatologic: Normal skin with deformed nail left great toe. Jagged nail 2nd right.  Vascular: Left foot pedal pulses not palpable. Right foot faintly palpable. Positive of left foot edema.  Neurologic: All epicritic and tactile sensations grossly intact.  Orthopedic: Excess sagittal plane motion of the first ray bilateral. Tight Achilles tendon bilateral.  Assessment: Ankle equinus bilateral. Excess sagittal plane motion first ray bilateral. PVD left foot. Improved lesser metatarsalgia from lateral weight shifting right foot. Nail deformity 2nd right.  Plan: Continue with stretch exercise. All nails debrided. Return in 3 months for RFC.

## 2016-01-25 DIAGNOSIS — E1121 Type 2 diabetes mellitus with diabetic nephropathy: Secondary | ICD-10-CM | POA: Diagnosis not present

## 2016-01-25 DIAGNOSIS — M159 Polyosteoarthritis, unspecified: Secondary | ICD-10-CM | POA: Diagnosis not present

## 2016-01-25 DIAGNOSIS — N183 Chronic kidney disease, stage 3 (moderate): Secondary | ICD-10-CM | POA: Diagnosis not present

## 2016-01-25 DIAGNOSIS — Z96649 Presence of unspecified artificial hip joint: Secondary | ICD-10-CM | POA: Diagnosis not present

## 2016-01-25 DIAGNOSIS — Z1211 Encounter for screening for malignant neoplasm of colon: Secondary | ICD-10-CM | POA: Diagnosis not present

## 2016-01-25 DIAGNOSIS — E7800 Pure hypercholesterolemia, unspecified: Secondary | ICD-10-CM | POA: Diagnosis not present

## 2016-01-25 DIAGNOSIS — I119 Hypertensive heart disease without heart failure: Secondary | ICD-10-CM | POA: Diagnosis not present

## 2016-01-25 DIAGNOSIS — Z6829 Body mass index (BMI) 29.0-29.9, adult: Secondary | ICD-10-CM | POA: Diagnosis not present

## 2016-01-25 DIAGNOSIS — M5030 Other cervical disc degeneration, unspecified cervical region: Secondary | ICD-10-CM | POA: Diagnosis not present

## 2016-01-25 DIAGNOSIS — Z79899 Other long term (current) drug therapy: Secondary | ICD-10-CM | POA: Diagnosis not present

## 2016-01-25 DIAGNOSIS — Z0001 Encounter for general adult medical examination with abnormal findings: Secondary | ICD-10-CM | POA: Diagnosis not present

## 2016-03-05 ENCOUNTER — Other Ambulatory Visit (HOSPITAL_COMMUNITY): Payer: Self-pay | Admitting: Pulmonary Disease

## 2016-03-05 ENCOUNTER — Ambulatory Visit (HOSPITAL_COMMUNITY)
Admission: RE | Admit: 2016-03-05 | Discharge: 2016-03-05 | Disposition: A | Discharge status: Home / Self Care | Payer: Medicare Other | Source: Ambulatory Visit | Attending: Pulmonary Disease | Admitting: Pulmonary Disease

## 2016-03-05 DIAGNOSIS — E1129 Type 2 diabetes mellitus with other diabetic kidney complication: Secondary | ICD-10-CM | POA: Diagnosis not present

## 2016-03-05 DIAGNOSIS — E7800 Pure hypercholesterolemia, unspecified: Secondary | ICD-10-CM | POA: Diagnosis not present

## 2016-03-05 DIAGNOSIS — Z96649 Presence of unspecified artificial hip joint: Secondary | ICD-10-CM | POA: Diagnosis not present

## 2016-03-05 DIAGNOSIS — M159 Polyosteoarthritis, unspecified: Secondary | ICD-10-CM | POA: Diagnosis not present

## 2016-03-05 DIAGNOSIS — M5030 Other cervical disc degeneration, unspecified cervical region: Secondary | ICD-10-CM | POA: Diagnosis not present

## 2016-03-05 DIAGNOSIS — R609 Edema, unspecified: Secondary | ICD-10-CM

## 2016-03-05 DIAGNOSIS — R600 Localized edema: Secondary | ICD-10-CM | POA: Diagnosis not present

## 2016-03-05 DIAGNOSIS — I70202 Unspecified atherosclerosis of native arteries of extremities, left leg: Secondary | ICD-10-CM | POA: Diagnosis not present

## 2016-03-05 DIAGNOSIS — Z6829 Body mass index (BMI) 29.0-29.9, adult: Secondary | ICD-10-CM | POA: Diagnosis not present

## 2016-03-05 DIAGNOSIS — Z79899 Other long term (current) drug therapy: Secondary | ICD-10-CM | POA: Diagnosis not present

## 2016-03-05 DIAGNOSIS — I119 Hypertensive heart disease without heart failure: Secondary | ICD-10-CM | POA: Diagnosis not present

## 2016-03-05 DIAGNOSIS — N183 Chronic kidney disease, stage 3 (moderate): Secondary | ICD-10-CM | POA: Diagnosis not present

## 2016-03-05 DIAGNOSIS — M7989 Other specified soft tissue disorders: Secondary | ICD-10-CM | POA: Insufficient documentation

## 2016-03-05 DIAGNOSIS — M25572 Pain in left ankle and joints of left foot: Secondary | ICD-10-CM | POA: Diagnosis not present

## 2016-04-15 ENCOUNTER — Ambulatory Visit: Payer: Medicare Other | Admitting: Podiatry

## 2016-04-16 ENCOUNTER — Ambulatory Visit (INDEPENDENT_AMBULATORY_CARE_PROVIDER_SITE_OTHER): Payer: Medicare Other | Admitting: Podiatry

## 2016-04-16 ENCOUNTER — Encounter: Payer: Self-pay | Admitting: Podiatry

## 2016-04-16 VITALS — BP 150/50 | HR 53

## 2016-04-16 DIAGNOSIS — B351 Tinea unguium: Secondary | ICD-10-CM | POA: Diagnosis not present

## 2016-04-16 DIAGNOSIS — M79604 Pain in right leg: Secondary | ICD-10-CM

## 2016-04-16 DIAGNOSIS — M216X1 Other acquired deformities of right foot: Secondary | ICD-10-CM | POA: Diagnosis not present

## 2016-04-16 DIAGNOSIS — M79673 Pain in unspecified foot: Secondary | ICD-10-CM

## 2016-04-16 DIAGNOSIS — M216X2 Other acquired deformities of left foot: Secondary | ICD-10-CM | POA: Diagnosis not present

## 2016-04-16 DIAGNOSIS — M7741 Metatarsalgia, right foot: Secondary | ICD-10-CM

## 2016-04-16 NOTE — Patient Instructions (Signed)
Seen for hypertrophic nails. Doing well with exercise. All nails debrided. Remember to wear compressions sock with swelling problem. Return in 3 months or as needed.

## 2016-04-16 NOTE — Progress Notes (Signed)
Subjective: 75 year old male presents with painful toe nails. Feet are doing better.  Doing daily exercise, stretch.  Blood sugar this morning was 120 after breakfast.  Podiatric History: Right foot lateral column pain has been going on since July 2016. This is much better now since the stretch exericse and shoe inserts.  He indicated during previous visit that he has Kidney condition that he lost 40% kidney function due to taking NSAIA from [place].  Been diabetic since age 64, and blood sugar still remains under control average 80--90.  Objective: Dermatologic: Normal skin with deformed nail left great toe. Jagged nail 2nd right.  Vascular: Left foot pedal pulses not palpable. Right foot faintly palpable. Positive of left foot edema.  Neurologic: All epicritic and tactile sensations grossly intact.  Orthopedic: Excess sagittal plane motion of the first ray bilateral. Tight Achilles tendon bilateral.  Assessment: Ankle equinus bilateral, improving with stretch exercise. Excess sagittal plane motion first ray bilateral. PVD left foot. Improved lesser metatarsalgia from lateral weight shifting right foot. Nail deformity 2nd right. Bilateral ankle edema.  Plan: Continue with stretch exercise. All nails debrided. Use compression socks for swelling problem.  Return in 3 months for RFC.

## 2016-06-18 DIAGNOSIS — I119 Hypertensive heart disease without heart failure: Secondary | ICD-10-CM | POA: Diagnosis not present

## 2016-06-18 DIAGNOSIS — Z79899 Other long term (current) drug therapy: Secondary | ICD-10-CM | POA: Diagnosis not present

## 2016-06-18 DIAGNOSIS — E7800 Pure hypercholesterolemia, unspecified: Secondary | ICD-10-CM | POA: Diagnosis not present

## 2016-06-18 DIAGNOSIS — M5030 Other cervical disc degeneration, unspecified cervical region: Secondary | ICD-10-CM | POA: Diagnosis not present

## 2016-06-18 DIAGNOSIS — J209 Acute bronchitis, unspecified: Secondary | ICD-10-CM | POA: Diagnosis not present

## 2016-06-18 DIAGNOSIS — E1121 Type 2 diabetes mellitus with diabetic nephropathy: Secondary | ICD-10-CM | POA: Diagnosis not present

## 2016-06-18 DIAGNOSIS — N183 Chronic kidney disease, stage 3 (moderate): Secondary | ICD-10-CM | POA: Diagnosis not present

## 2016-06-18 DIAGNOSIS — M159 Polyosteoarthritis, unspecified: Secondary | ICD-10-CM | POA: Diagnosis not present

## 2016-06-18 DIAGNOSIS — Z6829 Body mass index (BMI) 29.0-29.9, adult: Secondary | ICD-10-CM | POA: Diagnosis not present

## 2016-06-18 DIAGNOSIS — Z96649 Presence of unspecified artificial hip joint: Secondary | ICD-10-CM | POA: Diagnosis not present

## 2016-07-17 ENCOUNTER — Ambulatory Visit (INDEPENDENT_AMBULATORY_CARE_PROVIDER_SITE_OTHER): Payer: Medicare Other | Admitting: Podiatry

## 2016-07-17 ENCOUNTER — Encounter: Payer: Self-pay | Admitting: Podiatry

## 2016-07-17 DIAGNOSIS — M79673 Pain in unspecified foot: Secondary | ICD-10-CM | POA: Diagnosis not present

## 2016-07-17 DIAGNOSIS — B351 Tinea unguium: Secondary | ICD-10-CM

## 2016-07-17 DIAGNOSIS — M79671 Pain in right foot: Secondary | ICD-10-CM

## 2016-07-17 DIAGNOSIS — M216X9 Other acquired deformities of unspecified foot: Secondary | ICD-10-CM | POA: Diagnosis not present

## 2016-07-17 DIAGNOSIS — M216X2 Other acquired deformities of left foot: Secondary | ICD-10-CM

## 2016-07-17 DIAGNOSIS — M79672 Pain in left foot: Secondary | ICD-10-CM

## 2016-07-17 DIAGNOSIS — M216X1 Other acquired deformities of right foot: Secondary | ICD-10-CM

## 2016-07-17 NOTE — Progress Notes (Signed)
Subjective: 75 year old male presents with painful toe nails. Left ankle is doing better after taking fluid pills. Kidney doctor told him to double up with his fluid pills. Has not done daily exercise to stretch Achilles tendon. Blood sugar ranges between 120-140.  Podiatric History: History of Right foot lateral column pain that was improved with tendon stretch exercise.  He indicated during previous visit that he has Kidney condition that he lost 40% kidney function due to taking NSAIA from [place].  Been diabetic since age 25, and blood sugar runs between 120-140 now.  Objective: Dermatologic:  Hypertrophic nails on right foot. Deformed remnant nails on both great toe from old surgery. Jagged nail 2nd right.  Vascular: Left foot pedal pulses not palpable. Right foot faintly palpable.  Neurologic: All epicritic and tactile sensations grossly intact.  Orthopedic: Excess sagittal plane motion of the first ray bilateral. Tight Achilles tendon bilateral.  Assessment: Unresolved Ankle equinus bilateral, still very tight. PVD left foot. Hypertrophic nails right foot. Nail deformity both great toes.  Plan: Continue with stretch exercise. All nails debrided. Return in 3 months for RFC.

## 2016-07-17 NOTE — Patient Instructions (Addendum)
Seen for hypertrophic nails. Swelling is down on left ankle but the tendon is still very tight. Need daily stretch exercise for tight Achilles tendon L>R. All nails debrided. Return in 3 months or as needed.

## 2016-07-23 DIAGNOSIS — M159 Polyosteoarthritis, unspecified: Secondary | ICD-10-CM | POA: Diagnosis not present

## 2016-07-23 DIAGNOSIS — E7800 Pure hypercholesterolemia, unspecified: Secondary | ICD-10-CM | POA: Diagnosis not present

## 2016-07-23 DIAGNOSIS — N183 Chronic kidney disease, stage 3 (moderate): Secondary | ICD-10-CM | POA: Diagnosis not present

## 2016-07-23 DIAGNOSIS — Z79899 Other long term (current) drug therapy: Secondary | ICD-10-CM | POA: Diagnosis not present

## 2016-07-23 DIAGNOSIS — Z96649 Presence of unspecified artificial hip joint: Secondary | ICD-10-CM | POA: Diagnosis not present

## 2016-07-23 DIAGNOSIS — Z23 Encounter for immunization: Secondary | ICD-10-CM | POA: Diagnosis not present

## 2016-07-23 DIAGNOSIS — M47812 Spondylosis without myelopathy or radiculopathy, cervical region: Secondary | ICD-10-CM | POA: Diagnosis not present

## 2016-07-23 DIAGNOSIS — Z6829 Body mass index (BMI) 29.0-29.9, adult: Secondary | ICD-10-CM | POA: Diagnosis not present

## 2016-07-23 DIAGNOSIS — I119 Hypertensive heart disease without heart failure: Secondary | ICD-10-CM | POA: Diagnosis not present

## 2016-07-23 DIAGNOSIS — J209 Acute bronchitis, unspecified: Secondary | ICD-10-CM | POA: Diagnosis not present

## 2016-07-23 DIAGNOSIS — E1121 Type 2 diabetes mellitus with diabetic nephropathy: Secondary | ICD-10-CM | POA: Diagnosis not present

## 2016-10-17 ENCOUNTER — Encounter: Payer: Self-pay | Admitting: Podiatry

## 2016-10-17 ENCOUNTER — Ambulatory Visit (INDEPENDENT_AMBULATORY_CARE_PROVIDER_SITE_OTHER): Payer: Medicare Other | Admitting: Podiatry

## 2016-10-17 DIAGNOSIS — M216X2 Other acquired deformities of left foot: Secondary | ICD-10-CM

## 2016-10-17 DIAGNOSIS — M216X1 Other acquired deformities of right foot: Secondary | ICD-10-CM

## 2016-10-17 DIAGNOSIS — M79671 Pain in right foot: Secondary | ICD-10-CM | POA: Diagnosis not present

## 2016-10-17 DIAGNOSIS — B351 Tinea unguium: Secondary | ICD-10-CM

## 2016-10-17 DIAGNOSIS — M21969 Unspecified acquired deformity of unspecified lower leg: Secondary | ICD-10-CM | POA: Diagnosis not present

## 2016-10-17 DIAGNOSIS — M79672 Pain in left foot: Secondary | ICD-10-CM

## 2016-10-17 DIAGNOSIS — E1140 Type 2 diabetes mellitus with diabetic neuropathy, unspecified: Secondary | ICD-10-CM

## 2016-10-17 NOTE — Patient Instructions (Signed)
Seen for hypertrophic nails. All nails debrided. Both feet measured for diabetic shoes. Return in 3 months for routine foot care or as needed.

## 2016-10-17 NOTE — Progress Notes (Signed)
Subjective: 76 year old male presents with painful toe nails. Left ankle is doing better after taking fluid pills. Blood sugar ranges between 80-130 Patient is requesting a new pair diabetic shoes.  Podiatric History: History of Right foot lateral column pain that was improved with tendon stretch exercise.  He indicated during previous visit that he has Kidney condition that he lost 40% kidney function due to taking NSAIA from [place].  Been diabetic since age 18, and blood sugar runs between 120-140 now.  Objective: Dermatologic:  Hypertrophic nails on right foot. Deformed remnant nails on both great toe from old surgery. Jagged nail 2nd right.  Vascular: Left foot pedal pulses not palpable. Right foot faintly palpable.  Neurologic: All epicritic and tactile sensations grossly intact.  Orthopedic: Excess sagittal plane motion of the first ray bilateral. Tight Achilles tendon bilateral.  Assessment: Unresolved Ankle equinus bilateral, still very tight. PVD left foot. Hypertrophic nails right foot. Nail deformity both great toes.  Plan: Continue with stretch exercise. All nails debrided. Both feet measured for diabetic shoes. Return in 3 months for RFC.

## 2017-01-09 DIAGNOSIS — E7800 Pure hypercholesterolemia, unspecified: Secondary | ICD-10-CM | POA: Diagnosis not present

## 2017-01-09 DIAGNOSIS — I119 Hypertensive heart disease without heart failure: Secondary | ICD-10-CM | POA: Diagnosis not present

## 2017-01-09 DIAGNOSIS — Z96649 Presence of unspecified artificial hip joint: Secondary | ICD-10-CM | POA: Diagnosis not present

## 2017-01-09 DIAGNOSIS — E1151 Type 2 diabetes mellitus with diabetic peripheral angiopathy without gangrene: Secondary | ICD-10-CM | POA: Diagnosis not present

## 2017-01-09 DIAGNOSIS — M5030 Other cervical disc degeneration, unspecified cervical region: Secondary | ICD-10-CM | POA: Diagnosis not present

## 2017-01-09 DIAGNOSIS — Z79899 Other long term (current) drug therapy: Secondary | ICD-10-CM | POA: Diagnosis not present

## 2017-01-09 DIAGNOSIS — N183 Chronic kidney disease, stage 3 (moderate): Secondary | ICD-10-CM | POA: Diagnosis not present

## 2017-01-09 DIAGNOSIS — Z6829 Body mass index (BMI) 29.0-29.9, adult: Secondary | ICD-10-CM | POA: Diagnosis not present

## 2017-01-09 DIAGNOSIS — E1129 Type 2 diabetes mellitus with other diabetic kidney complication: Secondary | ICD-10-CM | POA: Diagnosis not present

## 2017-01-09 DIAGNOSIS — J209 Acute bronchitis, unspecified: Secondary | ICD-10-CM | POA: Diagnosis not present

## 2017-01-09 DIAGNOSIS — M159 Polyosteoarthritis, unspecified: Secondary | ICD-10-CM | POA: Diagnosis not present

## 2017-01-15 ENCOUNTER — Encounter: Payer: Self-pay | Admitting: Podiatry

## 2017-01-15 ENCOUNTER — Ambulatory Visit (INDEPENDENT_AMBULATORY_CARE_PROVIDER_SITE_OTHER): Payer: Medicare Other | Admitting: Podiatry

## 2017-01-15 DIAGNOSIS — B351 Tinea unguium: Secondary | ICD-10-CM

## 2017-01-15 DIAGNOSIS — M79672 Pain in left foot: Secondary | ICD-10-CM

## 2017-01-15 DIAGNOSIS — M79671 Pain in right foot: Secondary | ICD-10-CM

## 2017-01-15 NOTE — Progress Notes (Signed)
Subjective: 76year old male presents with painful toe nails. Patient request toe nails trimmed. Been having swollen feet after been on them too long. Been picking on nails on left side. Been slack on stretching.   Blood sugar ranges between 120-140.  Podiatric History: History of Right foot lateral column pain that was improved with tendon stretch exercise.  He indicated during previous visit that he has Kidney condition that he lost 40% kidney function due to taking NSAIA from [place].  Been diabetic since age 76, and blood sugar runs between 120-140 now.  Objective: Dermatologic:  Hypertrophic nails on right foot. Deformed remnant nails on both great toe from old surgery. Vascular: Left foot pedal pulses not palpable. Right foot faintly palpable.  Neurologic: All epicritic and tactile sensations grossly intact.  Orthopedic: Excess sagittal plane motion of the first ray bilateral. Tight Achilles tendon bilateral.  Assessment: Unresolved Ankle equinus bilateral, still very tight. PVD left foot. Hypertrophic nails right foot. Nail deformity both great toes.  Plan: Continue with stretch exercise. All nails debrided. Return in 3 months for RFC.

## 2017-01-15 NOTE — Patient Instructions (Signed)
Seen for hypertrophic nails. All nails debrided. Return in 3 months or as needed.  

## 2017-03-25 DIAGNOSIS — M5030 Other cervical disc degeneration, unspecified cervical region: Secondary | ICD-10-CM | POA: Diagnosis not present

## 2017-03-25 DIAGNOSIS — E1121 Type 2 diabetes mellitus with diabetic nephropathy: Secondary | ICD-10-CM | POA: Diagnosis not present

## 2017-03-25 DIAGNOSIS — Z96649 Presence of unspecified artificial hip joint: Secondary | ICD-10-CM | POA: Diagnosis not present

## 2017-03-25 DIAGNOSIS — M130 Polyarthritis, unspecified: Secondary | ICD-10-CM | POA: Diagnosis not present

## 2017-03-25 DIAGNOSIS — N182 Chronic kidney disease, stage 2 (mild): Secondary | ICD-10-CM | POA: Diagnosis not present

## 2017-03-25 DIAGNOSIS — Z6829 Body mass index (BMI) 29.0-29.9, adult: Secondary | ICD-10-CM | POA: Diagnosis not present

## 2017-03-25 DIAGNOSIS — Z0001 Encounter for general adult medical examination with abnormal findings: Secondary | ICD-10-CM | POA: Diagnosis not present

## 2017-03-25 DIAGNOSIS — Z125 Encounter for screening for malignant neoplasm of prostate: Secondary | ICD-10-CM | POA: Diagnosis not present

## 2017-03-25 DIAGNOSIS — Z79899 Other long term (current) drug therapy: Secondary | ICD-10-CM | POA: Diagnosis not present

## 2017-03-25 DIAGNOSIS — I119 Hypertensive heart disease without heart failure: Secondary | ICD-10-CM | POA: Diagnosis not present

## 2017-03-25 DIAGNOSIS — E1129 Type 2 diabetes mellitus with other diabetic kidney complication: Secondary | ICD-10-CM | POA: Diagnosis not present

## 2017-03-25 DIAGNOSIS — E1151 Type 2 diabetes mellitus with diabetic peripheral angiopathy without gangrene: Secondary | ICD-10-CM | POA: Diagnosis not present

## 2017-03-25 DIAGNOSIS — E7800 Pure hypercholesterolemia, unspecified: Secondary | ICD-10-CM | POA: Diagnosis not present

## 2017-04-16 ENCOUNTER — Encounter: Payer: Self-pay | Admitting: Podiatry

## 2017-04-16 ENCOUNTER — Ambulatory Visit (INDEPENDENT_AMBULATORY_CARE_PROVIDER_SITE_OTHER): Payer: Medicare Other | Admitting: Podiatry

## 2017-04-16 DIAGNOSIS — M79672 Pain in left foot: Secondary | ICD-10-CM | POA: Diagnosis not present

## 2017-04-16 DIAGNOSIS — B351 Tinea unguium: Secondary | ICD-10-CM | POA: Diagnosis not present

## 2017-04-16 DIAGNOSIS — M79671 Pain in right foot: Secondary | ICD-10-CM

## 2017-04-16 NOTE — Patient Instructions (Signed)
Seen for hypertrophic nails. All nails debrided. Continue with stretch exercise for tight achilles tendon.  Return in 3 months or as needed.

## 2017-04-16 NOTE — Progress Notes (Signed)
Subjective: 76year old male presents with painful toe nails. Patient request toe nails trimmed. Wasn't able to do stretch exercise due to bruised soft tissue on left hip. Blood sugar ranges between 120-140.  Podiatric History: History of Right foot lateral column pain that was improved with tendon stretch exercise.  He indicated during previous visit that he has Kidney condition that he lost 40% kidney function due to taking NSAIA from [place].  Been diabetic since age 77, and blood sugar runs between 120-140 now.  Objective: Dermatologic:  Hypertrophic nails on right foot. Deformed remnant nails on both great toe from old surgery. Vascular: Left foot pedal pulses not palpable. Right foot faintly palpable.  Neurologic: All epicritic and tactile sensations grossly intact.  Orthopedic: Excess sagittal plane motion of the first ray bilateral. Tight Achilles tendon bilateral.  Assessment: Unresolved Ankle equinus bilateral, still very tight. PVD left foot. Hypertrophic nails right foot. Nail deformity both great toes.  Plan: Continue with stretch exercise. All nails debrided. Return in 3 months for RFC.

## 2017-07-17 ENCOUNTER — Encounter: Payer: Self-pay | Admitting: Podiatry

## 2017-07-17 ENCOUNTER — Ambulatory Visit (INDEPENDENT_AMBULATORY_CARE_PROVIDER_SITE_OTHER): Payer: Medicare Other | Admitting: Podiatry

## 2017-07-17 DIAGNOSIS — M79671 Pain in right foot: Secondary | ICD-10-CM | POA: Diagnosis not present

## 2017-07-17 DIAGNOSIS — M79672 Pain in left foot: Secondary | ICD-10-CM | POA: Diagnosis not present

## 2017-07-17 DIAGNOSIS — B351 Tinea unguium: Secondary | ICD-10-CM

## 2017-07-17 NOTE — Progress Notes (Signed)
Subjective: 76 y.o. year old male patient presents complaining of painful nails. Patient requests toe nails trimmed. Patient walks with cane. Blood sugar remains between 70 and 90 before meal. It runs over 140 after meal. Denies any new problems.  Objective: Dermatologic: Severely deformed and recurring nail following nail surgery, old problem. Thick yellow deformed nails x 10.  Vascular: Pedal pulses are all palpable. Orthopedic: Contracted lesser digits  Neurologic: All epicritic and tactile sensations grossly intact.  Assessment: Painful dystrophic mycotic nails x 10.  Treatment: All mycotic nails debrided.  Return in 3 months or as needed.

## 2017-07-17 NOTE — Patient Instructions (Signed)
Seen for hypertrophic nails. No new problems noted. All nails debrided. Return in 3 months or as needed.  

## 2017-09-04 DIAGNOSIS — E1151 Type 2 diabetes mellitus with diabetic peripheral angiopathy without gangrene: Secondary | ICD-10-CM | POA: Diagnosis not present

## 2017-09-04 DIAGNOSIS — Z79899 Other long term (current) drug therapy: Secondary | ICD-10-CM | POA: Diagnosis not present

## 2017-09-04 DIAGNOSIS — M1000 Idiopathic gout, unspecified site: Secondary | ICD-10-CM | POA: Diagnosis not present

## 2017-09-04 DIAGNOSIS — E1129 Type 2 diabetes mellitus with other diabetic kidney complication: Secondary | ICD-10-CM | POA: Diagnosis not present

## 2017-09-04 DIAGNOSIS — M159 Polyosteoarthritis, unspecified: Secondary | ICD-10-CM | POA: Diagnosis not present

## 2017-09-04 DIAGNOSIS — E7800 Pure hypercholesterolemia, unspecified: Secondary | ICD-10-CM | POA: Diagnosis not present

## 2017-09-04 DIAGNOSIS — I119 Hypertensive heart disease without heart failure: Secondary | ICD-10-CM | POA: Diagnosis not present

## 2017-09-04 DIAGNOSIS — K5900 Constipation, unspecified: Secondary | ICD-10-CM | POA: Diagnosis not present

## 2017-09-04 DIAGNOSIS — Z6829 Body mass index (BMI) 29.0-29.9, adult: Secondary | ICD-10-CM | POA: Diagnosis not present

## 2017-09-04 DIAGNOSIS — N183 Chronic kidney disease, stage 3 (moderate): Secondary | ICD-10-CM | POA: Diagnosis not present

## 2017-09-04 DIAGNOSIS — M5030 Other cervical disc degeneration, unspecified cervical region: Secondary | ICD-10-CM | POA: Diagnosis not present

## 2017-09-04 DIAGNOSIS — Z96649 Presence of unspecified artificial hip joint: Secondary | ICD-10-CM | POA: Diagnosis not present

## 2017-10-16 ENCOUNTER — Ambulatory Visit (INDEPENDENT_AMBULATORY_CARE_PROVIDER_SITE_OTHER): Payer: Medicare Other | Admitting: Podiatry

## 2017-10-16 ENCOUNTER — Encounter: Payer: Self-pay | Admitting: Podiatry

## 2017-10-16 DIAGNOSIS — M79671 Pain in right foot: Secondary | ICD-10-CM

## 2017-10-16 DIAGNOSIS — B351 Tinea unguium: Secondary | ICD-10-CM

## 2017-10-16 DIAGNOSIS — M79672 Pain in left foot: Secondary | ICD-10-CM

## 2017-10-16 NOTE — Progress Notes (Signed)
Subjective: 77 y.o. year old male patient presents complaining of painful nails. Patient requests toe nails trimmed.  Diabetic under control.  Objective: Dermatologic: Thick yellow deformed nails x 10. Post surgical nail deformity both great toes. Vascular: Pedal pulses are all palpable. Mild pedal edema bilateral. Orthopedic: No growth deformities. Neurologic: All epicritic and tactile sensations grossly intact.  Assessment: Painful dystrophic mycotic nails x 10.  Treatment: All mycotic nails debrided.  Return in 3 months or as needed.

## 2017-10-16 NOTE — Patient Instructions (Signed)
Seen for hypertrophic nails. All nails debrided. Return in 3 months or as needed.  

## 2017-11-06 DIAGNOSIS — I119 Hypertensive heart disease without heart failure: Secondary | ICD-10-CM | POA: Diagnosis not present

## 2017-11-06 DIAGNOSIS — Z96649 Presence of unspecified artificial hip joint: Secondary | ICD-10-CM | POA: Diagnosis not present

## 2017-11-06 DIAGNOSIS — M158 Other polyosteoarthritis: Secondary | ICD-10-CM | POA: Diagnosis not present

## 2017-11-06 DIAGNOSIS — K5900 Constipation, unspecified: Secondary | ICD-10-CM | POA: Diagnosis not present

## 2017-11-06 DIAGNOSIS — Z79899 Other long term (current) drug therapy: Secondary | ICD-10-CM | POA: Diagnosis not present

## 2017-11-06 DIAGNOSIS — Z6829 Body mass index (BMI) 29.0-29.9, adult: Secondary | ICD-10-CM | POA: Diagnosis not present

## 2017-11-06 DIAGNOSIS — M5030 Other cervical disc degeneration, unspecified cervical region: Secondary | ICD-10-CM | POA: Diagnosis not present

## 2017-11-06 DIAGNOSIS — E1121 Type 2 diabetes mellitus with diabetic nephropathy: Secondary | ICD-10-CM | POA: Diagnosis not present

## 2017-11-06 DIAGNOSIS — E1151 Type 2 diabetes mellitus with diabetic peripheral angiopathy without gangrene: Secondary | ICD-10-CM | POA: Diagnosis not present

## 2017-11-06 DIAGNOSIS — B029 Zoster without complications: Secondary | ICD-10-CM | POA: Diagnosis not present

## 2017-11-06 DIAGNOSIS — E7800 Pure hypercholesterolemia, unspecified: Secondary | ICD-10-CM | POA: Diagnosis not present

## 2017-11-06 DIAGNOSIS — N183 Chronic kidney disease, stage 3 (moderate): Secondary | ICD-10-CM | POA: Diagnosis not present

## 2017-12-22 DIAGNOSIS — E1151 Type 2 diabetes mellitus with diabetic peripheral angiopathy without gangrene: Secondary | ICD-10-CM | POA: Diagnosis not present

## 2017-12-22 DIAGNOSIS — Z6829 Body mass index (BMI) 29.0-29.9, adult: Secondary | ICD-10-CM | POA: Diagnosis not present

## 2017-12-22 DIAGNOSIS — N183 Chronic kidney disease, stage 3 (moderate): Secondary | ICD-10-CM | POA: Diagnosis not present

## 2017-12-22 DIAGNOSIS — M1000 Idiopathic gout, unspecified site: Secondary | ICD-10-CM | POA: Diagnosis not present

## 2017-12-22 DIAGNOSIS — K5900 Constipation, unspecified: Secondary | ICD-10-CM | POA: Diagnosis not present

## 2017-12-22 DIAGNOSIS — M5090 Cervical disc disorder, unspecified, unspecified cervical region: Secondary | ICD-10-CM | POA: Diagnosis not present

## 2017-12-22 DIAGNOSIS — I119 Hypertensive heart disease without heart failure: Secondary | ICD-10-CM | POA: Diagnosis not present

## 2017-12-22 DIAGNOSIS — Z79899 Other long term (current) drug therapy: Secondary | ICD-10-CM | POA: Diagnosis not present

## 2017-12-22 DIAGNOSIS — E1121 Type 2 diabetes mellitus with diabetic nephropathy: Secondary | ICD-10-CM | POA: Diagnosis not present

## 2017-12-22 DIAGNOSIS — Z96643 Presence of artificial hip joint, bilateral: Secondary | ICD-10-CM | POA: Diagnosis not present

## 2017-12-22 DIAGNOSIS — E7800 Pure hypercholesterolemia, unspecified: Secondary | ICD-10-CM | POA: Diagnosis not present

## 2017-12-22 DIAGNOSIS — M159 Polyosteoarthritis, unspecified: Secondary | ICD-10-CM | POA: Diagnosis not present

## 2018-01-14 ENCOUNTER — Ambulatory Visit (INDEPENDENT_AMBULATORY_CARE_PROVIDER_SITE_OTHER): Payer: Medicare Other | Admitting: Podiatry

## 2018-01-14 ENCOUNTER — Encounter: Payer: Self-pay | Admitting: Podiatry

## 2018-01-14 DIAGNOSIS — B351 Tinea unguium: Secondary | ICD-10-CM | POA: Diagnosis not present

## 2018-01-14 DIAGNOSIS — M79672 Pain in left foot: Secondary | ICD-10-CM | POA: Diagnosis not present

## 2018-01-14 DIAGNOSIS — M79671 Pain in right foot: Secondary | ICD-10-CM | POA: Diagnosis not present

## 2018-01-14 NOTE — Progress Notes (Signed)
Subjective: 77 y.o. year old male patient presents complaining of painful nails and calluses. Patient requests toe nails, corns and calluses trimmed.  Ambulates with aid of a cane.  Objective: Dermatologic: Thick yellow deformed nails x 10. Plantar callus under the 5th MPJ left foot. Vascular: Pedal pulses are all palpable. Orthopedic: Contracted lesser digits bilateral. Neurologic: All epicritic and tactile sensations grossly intact.  Assessment: Dystrophic mycotic nails x 10. Plantar callus sub 5 left. Painful nails.  Treatment: All mycotic nails and calluses debrided.  Return in 3 months or as needed.

## 2018-01-14 NOTE — Patient Instructions (Signed)
Seen for hypertrophic nails. All nails and calluses debrided. Return in 3 months or as needed.  

## 2018-04-15 ENCOUNTER — Encounter: Payer: Self-pay | Admitting: Podiatry

## 2018-04-15 ENCOUNTER — Ambulatory Visit (INDEPENDENT_AMBULATORY_CARE_PROVIDER_SITE_OTHER): Payer: Medicare Other | Admitting: Podiatry

## 2018-04-15 DIAGNOSIS — M79671 Pain in right foot: Secondary | ICD-10-CM | POA: Diagnosis not present

## 2018-04-15 DIAGNOSIS — M79672 Pain in left foot: Secondary | ICD-10-CM

## 2018-04-15 DIAGNOSIS — B351 Tinea unguium: Secondary | ICD-10-CM

## 2018-04-15 NOTE — Patient Instructions (Signed)
Seen for hypertrophic nails. All nails debrided. Return in 3 months or as needed.  

## 2018-04-15 NOTE — Progress Notes (Signed)
Subjective: 77 y.o. year old male patient presents complaining of painful nails. Patient requests toe nails trimmed.   Objective: Dermatologic: Thick yellow deformed nails x 10 with remnant nail growth in both borders of right great toe nail. Vascular: Pedal pulses are all palpable. Swollen ankles L>R. Orthopedic: Contracted lesser digits  Neurologic: All epicritic and tactile sensations grossly intact.  Assessment: Dystrophic mycotic nails x 10. Painful nails.  Treatment: All mycotic nails debrided.  Return in 3 months or as needed.

## 2018-07-21 ENCOUNTER — Encounter: Payer: Self-pay | Admitting: Podiatry

## 2018-07-21 ENCOUNTER — Ambulatory Visit (INDEPENDENT_AMBULATORY_CARE_PROVIDER_SITE_OTHER): Payer: Medicare Other | Admitting: Podiatry

## 2018-07-21 DIAGNOSIS — M79671 Pain in right foot: Secondary | ICD-10-CM

## 2018-07-21 DIAGNOSIS — B351 Tinea unguium: Secondary | ICD-10-CM

## 2018-07-21 DIAGNOSIS — M79672 Pain in left foot: Secondary | ICD-10-CM

## 2018-07-21 NOTE — Patient Instructions (Signed)
Seen for hypertrophic nails. All nails debrided. Return needed.

## 2018-07-21 NOTE — Progress Notes (Signed)
Subjective: 77 y.o. year old male patient presents complaining of painful nails and swelling in left foot. Patient requests toe nails trimmed.   Objective: Dermatologic: Thick yellow deformed nails bilateral. Post surgical on left great toe that is deformed. Vascular: Pedal pulses are not palpable. Positive for pedal edema left foot. Orthopedic: No gross deformities. Neurologic: All epicritic and tactile sensations grossly intact.  Assessment: Dystrophic mycotic nails x 10. Painful nails.  Treatment: All mycotic nails debrided.  Return as needed.

## 2018-07-22 ENCOUNTER — Ambulatory Visit: Payer: Medicare Other | Admitting: Podiatry

## 2018-10-29 DIAGNOSIS — Z96643 Presence of artificial hip joint, bilateral: Secondary | ICD-10-CM | POA: Diagnosis not present

## 2018-10-29 DIAGNOSIS — N183 Chronic kidney disease, stage 3 (moderate): Secondary | ICD-10-CM | POA: Diagnosis not present

## 2018-10-29 DIAGNOSIS — E7800 Pure hypercholesterolemia, unspecified: Secondary | ICD-10-CM | POA: Diagnosis not present

## 2018-10-29 DIAGNOSIS — L600 Ingrowing nail: Secondary | ICD-10-CM | POA: Diagnosis not present

## 2018-10-29 DIAGNOSIS — E1151 Type 2 diabetes mellitus with diabetic peripheral angiopathy without gangrene: Secondary | ICD-10-CM | POA: Diagnosis not present

## 2018-10-29 DIAGNOSIS — E559 Vitamin D deficiency, unspecified: Secondary | ICD-10-CM | POA: Diagnosis not present

## 2018-10-29 DIAGNOSIS — Z79899 Other long term (current) drug therapy: Secondary | ICD-10-CM | POA: Diagnosis not present

## 2018-10-29 DIAGNOSIS — E1121 Type 2 diabetes mellitus with diabetic nephropathy: Secondary | ICD-10-CM | POA: Diagnosis not present

## 2018-10-29 DIAGNOSIS — M5080 Other cervical disc disorders, unspecified cervical region: Secondary | ICD-10-CM | POA: Diagnosis not present

## 2018-10-29 DIAGNOSIS — M159 Polyosteoarthritis, unspecified: Secondary | ICD-10-CM | POA: Diagnosis not present

## 2018-10-29 DIAGNOSIS — Z6829 Body mass index (BMI) 29.0-29.9, adult: Secondary | ICD-10-CM | POA: Diagnosis not present

## 2018-10-29 DIAGNOSIS — I119 Hypertensive heart disease without heart failure: Secondary | ICD-10-CM | POA: Diagnosis not present

## 2018-11-30 DIAGNOSIS — Z6829 Body mass index (BMI) 29.0-29.9, adult: Secondary | ICD-10-CM | POA: Diagnosis not present

## 2018-11-30 DIAGNOSIS — E1129 Type 2 diabetes mellitus with other diabetic kidney complication: Secondary | ICD-10-CM | POA: Diagnosis not present

## 2018-11-30 DIAGNOSIS — S80822A Blister (nonthermal), left lower leg, initial encounter: Secondary | ICD-10-CM | POA: Diagnosis not present

## 2018-11-30 DIAGNOSIS — R0989 Other specified symptoms and signs involving the circulatory and respiratory systems: Secondary | ICD-10-CM | POA: Diagnosis not present

## 2018-11-30 DIAGNOSIS — N183 Chronic kidney disease, stage 3 (moderate): Secondary | ICD-10-CM | POA: Diagnosis not present

## 2018-11-30 DIAGNOSIS — E7800 Pure hypercholesterolemia, unspecified: Secondary | ICD-10-CM | POA: Diagnosis not present

## 2018-11-30 DIAGNOSIS — M159 Polyosteoarthritis, unspecified: Secondary | ICD-10-CM | POA: Diagnosis not present

## 2018-11-30 DIAGNOSIS — E559 Vitamin D deficiency, unspecified: Secondary | ICD-10-CM | POA: Diagnosis not present

## 2018-11-30 DIAGNOSIS — E1151 Type 2 diabetes mellitus with diabetic peripheral angiopathy without gangrene: Secondary | ICD-10-CM | POA: Diagnosis not present

## 2018-11-30 DIAGNOSIS — Z96649 Presence of unspecified artificial hip joint: Secondary | ICD-10-CM | POA: Diagnosis not present

## 2018-11-30 DIAGNOSIS — M5090 Cervical disc disorder, unspecified, unspecified cervical region: Secondary | ICD-10-CM | POA: Diagnosis not present

## 2018-11-30 DIAGNOSIS — S80829A Blister (nonthermal), unspecified lower leg, initial encounter: Secondary | ICD-10-CM | POA: Diagnosis not present

## 2018-11-30 DIAGNOSIS — I119 Hypertensive heart disease without heart failure: Secondary | ICD-10-CM | POA: Diagnosis not present

## 2018-12-01 ENCOUNTER — Other Ambulatory Visit (HOSPITAL_COMMUNITY): Payer: Self-pay | Admitting: Pulmonary Disease

## 2018-12-01 DIAGNOSIS — R0989 Other specified symptoms and signs involving the circulatory and respiratory systems: Secondary | ICD-10-CM

## 2018-12-08 ENCOUNTER — Ambulatory Visit (HOSPITAL_COMMUNITY)
Admission: RE | Admit: 2018-12-08 | Discharge: 2018-12-08 | Disposition: A | Discharge status: Home / Self Care | Payer: Medicare Other | Source: Ambulatory Visit | Attending: Pulmonary Disease | Admitting: Pulmonary Disease

## 2018-12-08 DIAGNOSIS — I6523 Occlusion and stenosis of bilateral carotid arteries: Secondary | ICD-10-CM | POA: Insufficient documentation

## 2018-12-08 DIAGNOSIS — R0989 Other specified symptoms and signs involving the circulatory and respiratory systems: Secondary | ICD-10-CM | POA: Diagnosis not present

## 2018-12-14 ENCOUNTER — Encounter: Payer: Medicare Other | Attending: Physician Assistant | Admitting: Physician Assistant

## 2018-12-14 DIAGNOSIS — I129 Hypertensive chronic kidney disease with stage 1 through stage 4 chronic kidney disease, or unspecified chronic kidney disease: Secondary | ICD-10-CM | POA: Insufficient documentation

## 2018-12-14 DIAGNOSIS — I872 Venous insufficiency (chronic) (peripheral): Secondary | ICD-10-CM | POA: Insufficient documentation

## 2018-12-14 DIAGNOSIS — E11622 Type 2 diabetes mellitus with other skin ulcer: Secondary | ICD-10-CM | POA: Diagnosis not present

## 2018-12-14 DIAGNOSIS — H409 Unspecified glaucoma: Secondary | ICD-10-CM | POA: Diagnosis not present

## 2018-12-14 DIAGNOSIS — Z809 Family history of malignant neoplasm, unspecified: Secondary | ICD-10-CM | POA: Insufficient documentation

## 2018-12-14 DIAGNOSIS — M1990 Unspecified osteoarthritis, unspecified site: Secondary | ICD-10-CM | POA: Diagnosis not present

## 2018-12-14 DIAGNOSIS — E1140 Type 2 diabetes mellitus with diabetic neuropathy, unspecified: Secondary | ICD-10-CM | POA: Diagnosis not present

## 2018-12-14 DIAGNOSIS — Z833 Family history of diabetes mellitus: Secondary | ICD-10-CM | POA: Diagnosis not present

## 2018-12-14 DIAGNOSIS — E1151 Type 2 diabetes mellitus with diabetic peripheral angiopathy without gangrene: Secondary | ICD-10-CM | POA: Insufficient documentation

## 2018-12-14 DIAGNOSIS — Z8249 Family history of ischemic heart disease and other diseases of the circulatory system: Secondary | ICD-10-CM | POA: Insufficient documentation

## 2018-12-14 DIAGNOSIS — M109 Gout, unspecified: Secondary | ICD-10-CM | POA: Diagnosis not present

## 2018-12-14 DIAGNOSIS — N183 Chronic kidney disease, stage 3 (moderate): Secondary | ICD-10-CM | POA: Insufficient documentation

## 2018-12-14 DIAGNOSIS — Z96642 Presence of left artificial hip joint: Secondary | ICD-10-CM | POA: Diagnosis not present

## 2018-12-14 DIAGNOSIS — L97822 Non-pressure chronic ulcer of other part of left lower leg with fat layer exposed: Secondary | ICD-10-CM | POA: Diagnosis not present

## 2018-12-14 DIAGNOSIS — E1122 Type 2 diabetes mellitus with diabetic chronic kidney disease: Secondary | ICD-10-CM | POA: Diagnosis not present

## 2018-12-14 DIAGNOSIS — M7989 Other specified soft tissue disorders: Secondary | ICD-10-CM | POA: Diagnosis not present

## 2018-12-15 NOTE — Progress Notes (Signed)
BRITTIN, JANIK ([id number]) Visit Report for 12/14/2018 Chief Complaint Document Details Patient Name: Gary Cruz, Gary Cruz Date of Service: 12/14/2018 8:45 AM Medical Record Number: [id number] Patient Account Number: [id number] Date of Birth/Sex: 25-Aug-1941 (78 y.o. M) Treating RN: Harold Barban Primary Care Provider: Vincente Liberty Other Clinician: Referring Provider: Vincente Liberty Treating Provider/Extender: Melburn Hake, Onya Eutsler Weeks in Treatment: 0 Information Obtained from: Patient Chief Complaint Left LE ulcer Electronic Signature(s) Signed: 12/14/2018 5:00:45 PM By: Worthy Keeler PA-C Entered By: Worthy Keeler on 12/14/2018 09:34:34 Gary Cruz, Gary Cruz ([id number]) -------------------------------------------------------------------------------- HPI Details Patient Name: Gary Cruz Date of Service: 12/14/2018 8:45 AM Medical Record Number: [id number] Patient Account Number: [id number] Date of Birth/Sex: 1941/03/03 (78 y.o. M) Treating RN: Harold Barban Primary Care Provider: Vincente Liberty Other Clinician: Referring Provider: Vincente Liberty Treating Provider/Extender: Melburn Hake, Mariama Saintvil Weeks in Treatment: 0 History of Present Illness HPI Description: 12/14/18 on evaluation today patient actually appears to be doing somewhat poorly in regard to his left lateral leg where for the past two weeks he's had an ulcer that has been draining and causing some discomfort. He has a history of venous insufficiency, diabetes mellitus type II, chronic kidney disease stage III, and hypertension. He was noncompressible bilaterally and pulses were not palpable at this point. He actually is the patient the [facility] hospital has an appointment with nephrology on March 26. He did have a left hip replacement which he states since that time is had more drainage and swelling as far as the fluid is concerned. Nonetheless he says at this point that this has been given a lot of trouble and he's struggling  to keep things under control as far as that is concerned. It does appear that he really needs compression but again with the unknown vascular status other than the fact that we know he does not seem to have good flow I do not think that we are gonna be able to perform significant compression therapy on him at this point unfortunately. Electronic Signature(s) Signed: 12/14/2018 5:00:45 PM By: Worthy Keeler PA-C Entered By: Worthy Keeler on 12/14/2018 16:58:40 Gary Cruz, Gary Cruz ([id number]) -------------------------------------------------------------------------------- Physical Exam Details Patient Name: Gary Cruz Date of Service: 12/14/2018 8:45 AM Medical Record Number: [id number] Patient Account Number: [id number] Date of Birth/Sex: 18-Mar-1941 (78 y.o. M) Treating RN: Harold Barban Primary Care Provider: Vincente Liberty Other Clinician: Referring Provider: Vincente Liberty Treating Provider/Extender: STONE III, Mayo Owczarzak Weeks in Treatment: 0 Constitutional patient is hypertensive.. heart rate irregular.. respirations regular, non-labored and within target range for patient.Marland Kitchen temperature within target range for patient.. Well-nourished and well-hydrated in no acute distress. Eyes conjunctiva clear no eyelid edema noted. pupils equal round and reactive to light and accommodation. Ears, Nose, Mouth, and Throat no gross abnormality of ear auricles or external auditory canals. normal hearing noted during conversation. mucus membranes moist. Respiratory normal breathing without difficulty. clear to auscultation bilaterally. Cardiovascular regular rate and rhythm with normal S1, S2. Absent posterior tibial and dorsalis pedis pulses bilateral lower extremities. 2+ pitting edema of the bilateral lower extremities. Gastrointestinal (GI) soft, non-tender, non-distended, +BS. no ventral hernia noted. Musculoskeletal normal gait and posture. no significant deformity or arthritic changes,  no loss or range of motion, no clubbing. Psychiatric this patient is able to make decisions and demonstrates good insight into disease process. Alert and Oriented x 3. pleasant and cooperative. Notes Patient's wound that actually did appear to be a fairly superficial blister on the lateral aspect of his left leg. With that  being said this does not appear to be very deep in all which is excellent news. No sharp debridement was necessary nor performed today. Electronic Signature(s) Signed: 12/14/2018 5:00:45 PM By: Worthy Keeler PA-C Entered By: Worthy Keeler on 12/14/2018 16:59:25 Gary Cruz, Gary Cruz ([id number]) -------------------------------------------------------------------------------- Physician Orders Details Patient Name: Gary Cruz Date of Service: 12/14/2018 8:45 AM Medical Record Number: [id number] Patient Account Number: [id number] Date of Birth/Sex: 1941-09-18 (78 y.o. M) Treating RN: Harold Barban Primary Care Provider: Vincente Liberty Other Clinician: Referring Provider: Vincente Liberty Treating Provider/Extender: Melburn Hake, Ronit Marczak Weeks in Treatment: 0 Verbal / Phone Orders: No Diagnosis Coding ICD-10 Coding Code Description I87.2 Venous insufficiency (chronic) (peripheral) E11.622 Type 2 diabetes mellitus with other skin ulcer L97.822 Non-pressure chronic ulcer of other part of left lower leg with fat layer exposed N18.3 Chronic kidney disease, stage 3 (moderate) I10 Essential (primary) hypertension Wound Cleansing Wound #1 Left,Lateral Lower Leg o May shower with protection. - Do not get dressing wet. If so, Please change to dry clean dressing Primary Wound Dressing Wound #1 Left,Lateral Lower Leg o Silver Alginate Secondary Dressing Wound #1 Left,Lateral Lower Leg o XtraSorb - Kerlix and Conform Dressing Change Frequency Wound #1 Left,Lateral Lower Leg o Other: - Change Dressing Thursday or Friday Follow-up Appointments Wound #1 Left,Lateral  Lower Leg o Return Appointment in 1 week. Edema Control Wound #1 Left,Lateral Lower Leg o Other: - Tubi Grip Electronic Signature(s) Signed: 12/14/2018 10:54:38 AM By: Harold Barban Signed: 12/14/2018 5:00:45 PM By: Worthy Keeler PA-C Entered By: Harold Barban on 12/14/2018 09:48:46 Gary Cruz, Gary Cruz ([id number]) -------------------------------------------------------------------------------- Problem List Details Patient Name: Gary Cruz Date of Service: 12/14/2018 8:45 AM Medical Record Number: [id number] Patient Account Number: [id number] Date of Birth/Sex: [birth year] (77 y.o. M) Treating RN: Harold Barban Primary Care Provider: Vincente Liberty Other Clinician: Referring Provider: Vincente Liberty Treating Provider/Extender: Melburn Hake, Mckynzie Liwanag Weeks in Treatment: 0 Active Problems ICD-10 Evaluated Encounter Code Description Active Date Today Diagnosis I87.2 Venous insufficiency (chronic) (peripheral) 12/14/2018 No Yes E11.622 Type 2 diabetes mellitus with other skin ulcer 12/14/2018 No Yes L97.822 Non-pressure chronic ulcer of other part of left lower leg with 12/14/2018 No Yes fat layer exposed N18.3 Chronic kidney disease, stage 3 (moderate) 12/14/2018 No Yes I10 Essential (primary) hypertension 12/14/2018 No Yes Inactive Problems Resolved Problems Electronic Signature(s) Signed: 12/14/2018 5:00:45 PM By: Worthy Keeler PA-C Entered By: Worthy Keeler on 12/14/2018 09:34:19 Gary Cruz, Gary Cruz ([id number]) -------------------------------------------------------------------------------- Progress Note Details Patient Name: Gary Cruz Date of Service: 12/14/2018 8:45 AM Medical Record Number: [id number] Patient Account Number: [id number] Date of Birth/Sex: Mar 14, 1941 (77 y.o. M) Treating RN: Harold Barban Primary Care Provider: Vincente Liberty Other Clinician: Referring Provider: Vincente Liberty Treating Provider/Extender: Melburn Hake, Davontay Watlington Weeks in Treatment:  0 Subjective Chief Complaint Information obtained from Patient Left LE ulcer History of Present Illness (HPI) 12/14/18 on evaluation today patient actually appears to be doing somewhat poorly in regard to his left lateral leg where for the past two weeks he's had an ulcer that has been draining and causing some discomfort. He has a history of venous insufficiency, diabetes mellitus type II, chronic kidney disease stage III, and hypertension. He was noncompressible bilaterally and pulses were not palpable at this point. He actually is the patient the [facility] hospital has an appointment with nephrology on March 26. He did have a left hip replacement which he states since that time is had more drainage and swelling as far as the fluid is concerned. Nonetheless  he says at this point that this has been given a lot of trouble and he's struggling to keep things under control as far as that is concerned. It does appear that he really needs compression but again with the unknown vascular status other than the fact that we know he does not seem to have good flow I do not think that we are gonna be able to perform significant compression therapy on him at this point unfortunately. Wound History Patient presents with 1 open wound that has been present for approximately 2 weeks. Patient has been treating wound in the following manner: peroxide, soap and water. Laboratory tests have been performed in the last month. Patient reportedly has not tested positive for an antibiotic resistant organism. Patient reportedly has not tested positive for osteomyelitis. Patient reportedly has had testing performed to evaluate circulation in the legs. Patient History Information obtained from Patient, Chart. Allergies No Known Allergies Family History Cancer - Father, Diabetes - Mother,Father,Siblings, Heart Disease - Mother,Father, Hypertension - Mother,Father,Siblings, No family history of Kidney Disease, Lung Disease,  Seizures, Stroke, Thyroid Problems, Tuberculosis. Social History Never smoker, Marital Status - Married, Alcohol Use - Moderate, Drug Use - No History, Caffeine Use - Daily. Medical History Eyes Patient has history of Glaucoma Denies history of Cataracts, Optic Neuritis Ear/Nose/Mouth/Throat Denies history of Chronic sinus problems/congestion, Middle ear problems Hematologic/Lymphatic Denies history of Anemia, Hemophilia, Human Immunodeficiency Virus, Lymphedema, Sickle Cell Disease Respiratory Denies history of Aspiration, Asthma, Chronic Obstructive Pulmonary Disease (COPD), Pneumothorax, Sleep Apnea, Bielicki, Jasier H. ([id number]) Tuberculosis Cardiovascular Patient has history of Hypertension Denies history of Angina, Arrhythmia, Congestive Heart Failure, Coronary Artery Disease, Deep Vein Thrombosis, Myocardial Infarction, Peripheral Arterial Disease, Peripheral Venous Disease, Phlebitis, Vasculitis Gastrointestinal Denies history of Cirrhosis , Colitis, Crohn s, Hepatitis A, Hepatitis B, Hepatitis C Endocrine Patient has history of Type II Diabetes Denies history of Type I Diabetes Genitourinary Denies history of End Stage Renal Disease Immunological Denies history of Lupus Erythematosus, Raynaud s, Scleroderma Integumentary (Skin) Denies history of History of Burn, History of pressure wounds Musculoskeletal Patient has history of Gout, Osteoarthritis Denies history of Rheumatoid Arthritis, Osteomyelitis Neurologic Patient has history of Neuropathy - feet Denies history of Dementia, Quadriplegia, Paraplegia, Seizure Disorder Oncologic Denies history of Received Chemotherapy, Received Radiation Psychiatric Denies history of Anorexia/bulimia, Confinement Anxiety Patient is treated with Oral Agents. Blood sugar is not tested. Review of Systems (ROS) Constitutional Symptoms (General Health) The patient has no complaints or symptoms. Eyes Complains or has symptoms of  Glasses / Contacts. Ear/Nose/Mouth/Throat The patient has no complaints or symptoms. Hematologic/Lymphatic The patient has no complaints or symptoms. Respiratory The patient has no complaints or symptoms. Cardiovascular Complains or has symptoms of LE edema. Denies complaints or symptoms of Chest pain. Gastrointestinal The patient has no complaints or symptoms. Endocrine The patient has no complaints or symptoms. Genitourinary The patient has no complaints or symptoms. Immunological Complains or has symptoms of Itching - abdominal area. Integumentary (Skin) Complains or has symptoms of Wounds, Bleeding or bruising tendency. Denies complaints or symptoms of Breakdown, Swelling. Musculoskeletal The patient has no complaints or symptoms. Neurologic The patient has no complaints or symptoms. Oncologic The patient has no complaints or symptoms. GREGREY, BLOYD ([id number]) Psychiatric The patient has no complaints or symptoms. Objective Constitutional patient is hypertensive.. heart rate irregular.. respirations regular, non-labored and within target range for patient.Marland Kitchen temperature within target range for patient.. Well-nourished and well-hydrated in no acute distress. Vitals Time Taken: 8:47 AM, Height: 67 in, Weight:  194 lbs, Source: Measured, BMI: 30.4, Temperature: 97.6 F, Pulse: 80 bpm, Respiratory Rate: 16 breaths/min, Blood Pressure: 155/67 mmHg. Eyes conjunctiva clear no eyelid edema noted. pupils equal round and reactive to light and accommodation. Ears, Nose, Mouth, and Throat no gross abnormality of ear auricles or external auditory canals. normal hearing noted during conversation. mucus membranes moist. Respiratory normal breathing without difficulty. clear to auscultation bilaterally. Cardiovascular regular rate and rhythm with normal S1, S2. Absent posterior tibial and dorsalis pedis pulses bilateral lower extremities. 2+ pitting edema of the bilateral lower  extremities. Gastrointestinal (GI) soft, non-tender, non-distended, +BS. no ventral hernia noted. Musculoskeletal normal gait and posture. no significant deformity or arthritic changes, no loss or range of motion, no clubbing. Psychiatric this patient is able to make decisions and demonstrates good insight into disease process. Alert and Oriented x 3. pleasant and cooperative. General Notes: Patient's wound that actually did appear to be a fairly superficial blister on the lateral aspect of his left leg. With that being said this does not appear to be very deep in all which is excellent news. No sharp debridement was necessary nor performed today. Integumentary (Hair, Skin) Wound #1 status is Open. Original cause of wound was Gradually Appeared. The wound is located on the Left,Lateral Lower Leg. The wound measures 3.5cm length x 3.7cm width x 0.1cm depth; 10.171cm^2 area and 1.017cm^3 volume. There is Fat Layer (Subcutaneous Tissue) Exposed exposed. There is no tunneling or undermining noted. There is a medium amount of serous drainage noted. The wound margin is flat and intact. There is small (1-33%) pink granulation within the wound bed. There is a large (67-100%) amount of necrotic tissue within the wound bed including Adherent Slough. The periwound skin appearance exhibited: Induration, Erythema. The periwound skin appearance did not exhibit: Callus, Crepitus, Excoriation, Rash, Scarring, Dry/Scaly, Maceration, Atrophie Blanche, Cyanosis, Ecchymosis, Hemosiderin Staining, Mottled, Pallor, Filo, Vidal H. ([id number]) Rubor. The surrounding wound skin color is noted with erythema which is circumferential. Assessment Active Problems ICD-10 Venous insufficiency (chronic) (peripheral) Type 2 diabetes mellitus with other skin ulcer Non-pressure chronic ulcer of other part of left lower leg with fat layer exposed Chronic kidney disease, stage 3 (moderate) Essential (primary)  hypertension Plan Wound Cleansing: Wound #1 Left,Lateral Lower Leg: May shower with protection. - Do not get dressing wet. If so, Please change to dry clean dressing Primary Wound Dressing: Wound #1 Left,Lateral Lower Leg: Silver Alginate Secondary Dressing: Wound #1 Left,Lateral Lower Leg: XtraSorb - Kerlix and Conform Dressing Change Frequency: Wound #1 Left,Lateral Lower Leg: Other: - Change Dressing Thursday or Friday Follow-up Appointments: Wound #1 Left,Lateral Lower Leg: Return Appointment in 1 week. Edema Control: Wound #1 Left,Lateral Lower Leg: Other: - Tubi Grip At this time my suggestion is gonna be that we go ahead and initiate treatment with the above wound care measures for the next week. This should help some with the swelling of his leg and hopefully the overall improvement of the wound. With that being said I think he needs more formal testing if he has not had it with vascular as far as his blood flows concern so that appropriate compression therapy can be utilized he's gonna talk to the [place] when he goes there on the 26th of this month. That is December 31, 2018. Otherwise I'll see were things stand one week. Please see above for specific wound care orders. We will see patient for re-evaluation in 1 week(s) here in the clinic. If anything worsens or changes patient will contact  our office for additional recommendations. Electronic Signature(s) Signed: 12/14/2018 5:00:45 PM By: Herbert Pun, Jasn H. ([id number]) Entered By: Worthy Keeler on 12/14/2018 17:00:16 WINFRED, IIAMS ([id number]) -------------------------------------------------------------------------------- ROS/PFSH Details Patient Name: Gary Cruz Date of Service: 12/14/2018 8:45 AM Medical Record Number: [id number] Patient Account Number: [id number] Date of Birth/Sex: July 05, 1941 (77 y.o. M) Treating RN: Cornell Barman Primary Care Provider: Vincente Liberty Other Clinician: Referring  Provider: Vincente Liberty Treating Provider/Extender: Melburn Hake, Ruchy Wildrick Weeks in Treatment: 0 Information Obtained From Patient Chart Wound History Do you currently have one or more open woundso Yes How many open wounds do you currently haveo 1 Approximately how long have you had your woundso 2 weeks How have you been treating your wound(s) until nowo peroxide, soap and water Has your wound(s) ever healed and then re-openedo No Have you had any lab work done in the past montho Yes Who ordered the lab work [facility] Have you tested positive for an antibiotic resistant organism (MRSA, VRE)o No Have you tested positive for osteomyelitis (bone infection)o No Have you had any tests for circulation on your legso Yes Who ordered the test[facility] Eyes Complaints and Symptoms: Positive for: Glasses / Contacts Medical History: Positive for: Glaucoma Negative for: Cataracts; Optic Neuritis Cardiovascular Complaints and Symptoms: Positive for: LE edema Negative for: Chest pain Medical History: Positive for: Hypertension Negative for: Angina; Arrhythmia; Congestive Heart Failure; Coronary Artery Disease; Deep Vein Thrombosis; Myocardial Infarction; Peripheral Arterial Disease; Peripheral Venous Disease; Phlebitis; Vasculitis Immunological Complaints and Symptoms: Positive for: Itching - abdominal area Medical History: Negative for: Lupus Erythematosus; Raynaudos; Scleroderma Integumentary (Skin) Complaints and Symptoms: Positive for: Wounds; Bleeding or bruising tendency Negative for: Breakdown; Swelling VASH, QUEZADA ([id number]) Medical History: Negative for: History of Burn; History of pressure wounds Constitutional Symptoms (General Health) Complaints and Symptoms: No Complaints or Symptoms Ear/Nose/Mouth/Throat Complaints and Symptoms: No Complaints or Symptoms Medical History: Negative for: Chronic sinus problems/congestion; Middle ear  problems Hematologic/Lymphatic Complaints and Symptoms: No Complaints or Symptoms Medical History: Negative for: Anemia; Hemophilia; Human Immunodeficiency Virus; Lymphedema; Sickle Cell Disease Respiratory Complaints and Symptoms: No Complaints or Symptoms Medical History: Negative for: Aspiration; Asthma; Chronic Obstructive Pulmonary Disease (COPD); Pneumothorax; Sleep Apnea; Tuberculosis Gastrointestinal Complaints and Symptoms: No Complaints or Symptoms Medical History: Negative for: Cirrhosis ; Colitis; Crohnos; Hepatitis A; Hepatitis B; Hepatitis C Endocrine Complaints and Symptoms: No Complaints or Symptoms Medical History: Positive for: Type II Diabetes Negative for: Type I Diabetes Time with diabetes: 25 years Treated with: Oral agents Blood sugar tested every day: No Genitourinary Complaints and Symptoms: No Complaints or Symptoms Medical HistoryJOVANN, LUSE ([id number]) Negative for: End Stage Renal Disease Musculoskeletal Complaints and Symptoms: No Complaints or Symptoms Medical History: Positive for: Gout; Osteoarthritis Negative for: Rheumatoid Arthritis; Osteomyelitis Neurologic Complaints and Symptoms: No Complaints or Symptoms Medical History: Positive for: Neuropathy - feet Negative for: Dementia; Quadriplegia; Paraplegia; Seizure Disorder Oncologic Complaints and Symptoms: No Complaints or Symptoms Medical History: Negative for: Received Chemotherapy; Received Radiation Psychiatric Complaints and Symptoms: No Complaints or Symptoms Medical History: Negative for: Anorexia/bulimia; Confinement Anxiety HBO Extended History Items Eyes: Glaucoma Immunizations Pneumococcal Vaccine: Received Pneumococcal Vaccination: Yes Implantable Devices None Family and Social History Cancer: Yes - Father; Diabetes: Yes - Mother,Father,Siblings; Heart Disease: Yes - Mother,Father; Hypertension: Yes - Mother,Father,Siblings; Kidney Disease: No; Lung  Disease: No; Seizures: No; Stroke: No; Thyroid Problems: No; Tuberculosis: No; Never smoker; Marital Status - Married; Alcohol Use: Moderate; Drug Use: No History; Caffeine Use:  Daily; Advanced Directives: No; Living Will: No; Medical Power of Attorney: No Electronic Signature(s) Signed: 12/14/2018 5:00:45 PM By: Worthy Keeler PA-C Signed: 12/14/2018 5:12:46 PM By: Gretta Cool BSN, RN, CWS, Kim RN, BSN Norman, Damari HMarland Kitchen ([id number]) Entered By: Gretta Cool, BSN, RN, CWS, Kim on 12/14/2018 08:56:10 MONTRAIL, MEHRER ([id number]) -------------------------------------------------------------------------------- SuperBill Details Patient Name: Gary Cruz Date of Service: 12/14/2018 Medical Record Number: [id number] Patient Account Number: [id number] Date of Birth/Sex: 1941-02-27 (77 y.o. M) Treating RN: Harold Barban Primary Care Provider: Vincente Liberty Other Clinician: Referring Provider: Vincente Liberty Treating Provider/Extender: Melburn Hake, Lilyth Lawyer Weeks in Treatment: 0 Diagnosis Coding ICD-10 Codes Code Description I87.2 Venous insufficiency (chronic) (peripheral) E11.622 Type 2 diabetes mellitus with other skin ulcer L97.822 Non-pressure chronic ulcer of other part of left lower leg with fat layer exposed N18.3 Chronic kidney disease, stage 3 (moderate) I10 Essential (primary) hypertension Facility Procedures CPT4 Code: [id number] Description: 99213 - WOUND CARE VISIT-LEV 3 EST PT Modifier: Quantity: 1 Physician Procedures CPT4 Code Description: 1840375 WC PHYS LEVEL 3 o NEW PT ICD-10 Diagnosis Description I87.2 Venous insufficiency (chronic) (peripheral) E11.622 Type 2 diabetes mellitus with other skin ulcer L97.822 Non-pressure chronic ulcer of other part of left lower  leg wi N18.3 Chronic kidney disease, stage 3 (moderate) Modifier: th fat layer expos Quantity: 1 ed Electronic Signature(s) Signed: 12/14/2018 5:00:45 PM By: Worthy Keeler PA-C Entered By: Worthy Keeler on 12/14/2018  17:00:28

## 2018-12-15 NOTE — Progress Notes (Signed)
Gary Cruz ([id number]) Visit Report for 12/14/2018 Allergy List Details Patient Name: Gary Cruz, Gary Cruz Date of Service: 12/14/2018 8:45 AM Medical Record Number: [id number] Patient Account Number: [id number] Date of Birth/Sex: 1941/10/02 (77 y.o. M) Treating RN: Cornell Barman Primary Care Dannielle Baskins: Vincente Liberty Other Clinician: Referring Tifani Dack: Vincente Liberty Treating Marbin Olshefski/Extender: Melburn Hake, HOYT Weeks in Treatment: 0 Allergies Active Allergies No Known Allergies Allergy Notes Electronic Signature(s) Signed: 12/14/2018 5:12:46 PM By: Gretta Cool, BSN, RN, CWS, Kim RN, BSN Entered By: Gretta Cool, BSN, RN, CWS, Kim on 12/14/2018 09:12:46 Gary Cruz ([id number]) -------------------------------------------------------------------------------- Arrival Information Details Patient Name: Gary Cruz Date of Service: 12/14/2018 8:45 AM Medical Record Number: [id number] Patient Account Number: [id number] Date of Birth/Sex: Sep 25, 1941 (77 y.o. M) Treating RN: Cornell Barman Primary Care Cloteal Isaacson: Vincente Liberty Other Clinician: Referring Orenthal Debski: Vincente Liberty Treating Providence Stivers/Extender: Melburn Hake, HOYT Weeks in Treatment: 0 Visit Information Patient Arrived: Ambulatory Arrival Time: 08:44 Accompanied By: self Transfer Assistance: None Patient Identification Verified: Yes Secondary Verification Process Completed: Yes Patient Has Alerts: Yes Patient Alerts: DMII Electronic Signature(s) Signed: 12/14/2018 5:12:46 PM By: Gretta Cool, BSN, RN, CWS, Kim RN, BSN Entered By: Gretta Cool, BSN, RN, CWS, Kim on 12/14/2018 08:47:03 Gary Cruz ([id number]) -------------------------------------------------------------------------------- Clinic Level of Care Assessment Details Patient Name: Gary Cruz Date of Service: 12/14/2018 8:45 AM Medical Record Number: [id number] Patient Account Number: [id number] Date of Birth/Sex: 1940/10/21 (77 y.o. M) Treating RN: Harold Barban Primary Care  Siena Poehler: Vincente Liberty Other Clinician: Referring Chasen Mendell: Vincente Liberty Treating Brylen Wagar/Extender: Melburn Hake, HOYT Weeks in Treatment: 0 Clinic Level of Care Assessment Items TOOL 3 Quantity Score []  - Use when EandM and Procedure is performed on FOLLOW-UP visit 0 ASSESSMENTS - Nursing Assessment / Reassessment X - Reassessment of Co-morbidities (includes updates in patient status) 1 10 X- 1 5 Reassessment of Adherence to Treatment Plan ASSESSMENTS - Wound and Skin Assessment / Reassessment []  - Points for Wound Assessment can only be taken for a new wound of unknown or different 0 etiology and a procedure is NOT performed to that wound X- 1 5 Simple Wound Assessment / Reassessment - one wound []  - 0 Complex Wound Assessment / Reassessment - multiple wounds []  - 0 Dermatologic / Skin Assessment (not related to wound area) ASSESSMENTS - Focused Assessment []  - Circumferential Edema Measurements - multi extremities 0 []  - 0 Nutritional Assessment / Counseling / Intervention []  - 0 Lower Extremity Assessment (monofilament, tuning fork, pulses) X- 1 10 Peripheral Arterial Disease Assessment (using hand held doppler) ASSESSMENTS - Ostomy and/or Continence Assessment and Care []  - Incontinence Assessment and Management 0 []  - 0 Ostomy Care Assessment and Management (repouching, etc.) PROCESS - Coordination of Care []  - Points for Discharge Coordination can only be taken for a new wound of unknown or different 0 etiology and a procedure is NOT performed to that wound X- 1 15 Simple Patient / Family Education for ongoing care []  - 0 Complex (extensive) Patient / Family Education for ongoing care X- 1 10 Staff obtains Programmer, systems, Records, Test Results / Process Orders []  - 0 Staff telephones HHA, Nursing Homes / Clarify orders / etc []  - 0 Routine Transfer to another Facility (non-emergent condition) []  - 0 Routine Hospital Admission (non-emergent condition) Gary Cruz. ([id number]) []  - 0 New Admissions / Biomedical engineer / Ordering NPWT, Apligraf, etc. []  - 0 Emergency Hospital Admission (emergent condition) X- 1 10 Simple Discharge Coordination []  - 0 Complex (extensive) Discharge Coordination PROCESS - Special Needs []  -  Pediatric / Minor Patient Management 0 []  - 0 Isolation Patient Management []  - 0 Hearing / Language / Visual special needs []  - 0 Assessment of Community assistance (transportation, D/C planning, etc.) []  - 0 Additional assistance / Altered mentation []  - 0 Support Surface(s) Assessment (bed, cushion, seat, etc.) INTERVENTIONS - Wound Cleansing / Measurement []  - Points for Wound Cleaning / Measurement, Wound Dressing, Specimen Collection and 0 Specimen taken to lab can only be taken for a new wound of unknown or different etiology and a procedure is NOT performed to that wound X- 1 5 Simple Wound Cleansing - one wound []  - 0 Complex Wound Cleansing - multiple wounds X- 1 5 Wound Imaging (photographs - any number of wounds) []  - 0 Wound Tracing (instead of photographs) X- 1 5 Simple Wound Measurement - one wound []  - 0 Complex Wound Measurement - multiple wounds INTERVENTIONS - Wound Dressings X - Small Wound Dressing one or multiple wounds 1 10 []  - 0 Medium Wound Dressing one or multiple wounds []  - 0 Large Wound Dressing one or multiple wounds INTERVENTIONS - Miscellaneous []  - External ear exam 0 []  - 0 Specimen Collection (cultures, biopsies, blood, body fluids, etc.) []  - 0 Specimen(s) / Culture(s) sent or taken to Lab for analysis []  - 0 Patient Transfer (multiple staff / Civil Service fast streamer / Similar devices) []  - 0 Simple Staple / Suture removal (25 or less) []  - 0 Complex Staple / Suture removal (26 or more) Kuk, Ladale H. ([id number]) []  - 0 Hypo / Hyperglycemic Management (close monitor of Blood Glucose) []  - 0 Ankle / Brachial Index (ABI) - do not check if billed separately X- 1  5 Vital Signs Has the patient been seen at the hospital within the last three years: Yes Total Score: 95 Level Of Care: New/Established - Level 3 Electronic Signature(s) Signed: 12/14/2018 10:54:38 AM By: Harold Barban Entered By: Harold Barban on 12/14/2018 09:43:33 Shearer, Jas Lemmie Evens ([id number]) -------------------------------------------------------------------------------- Encounter Discharge Information Details Patient Name: Gary Cruz Date of Service: 12/14/2018 8:45 AM Medical Record Number: [id number] Patient Account Number: [id number] Date of Birth/Sex: 06/08/1941 (77 y.o. M) Treating RN: Army Melia Primary Care Zaveon Gillen: Vincente Liberty Other Clinician: Referring Haunani Dickard: Vincente Liberty Treating Tyliyah Mcmeekin/Extender: Melburn Hake, HOYT Weeks in Treatment: 0 Encounter Discharge Information Items Discharge Condition: Stable Ambulatory Status: Walker Discharge Destination: Home Transportation: Private Auto Accompanied By: self Schedule Follow-up Appointment: Yes Clinical Summary of Care: Electronic Signature(s) Signed: 12/14/2018 11:22:39 AM By: Army Melia Entered By: Army Melia on 12/14/2018 10:11:40 Gary Cruz ([id number]) -------------------------------------------------------------------------------- Lower Extremity Assessment Details Patient Name: Gary Cruz Date of Service: 12/14/2018 8:45 AM Medical Record Number: [id number] Patient Account Number: [id number] Date of Birth/Sex: 09-14-1941 (77 y.o. M) Treating RN: Cornell Barman Primary Care Lateya Dauria: Vincente Liberty Other Clinician: Referring Cederic Mozley: Vincente Liberty Treating Hoover Grewe/Extender: Melburn Hake, HOYT Weeks in Treatment: 0 Edema Assessment Assessed: [Left: No] [Right: No] [Left: Edema] [Right: :] Calf Left: Right: Point of Measurement: 30 cm From Medial Instep 35.5 cm 32 cm Ankle Left: Right: Point of Measurement: 12 cm From Medial Instep 24 cm 23.2 cm Vascular  Assessment Claudication: Claudication Assessment [Left:None] [Right:None] Pulses: Dorsalis Pedis Palpable: [Left:No] [Right:No] Posterior Tibial Palpable: [Left:No] [Right:No] Extremity colors, hair growth, and conditions: Extremity Color: [Left:Pale] [Right:Pale] Hair Growth on Extremity: [Left:No] [Right:No] Temperature of Extremity: [Left:Warm] [Right:Warm] Capillary Refill: [Left:< 3 seconds] [Right:< 3 seconds] Blood Pressure: Dorsalis Pedis: [Left:Dorsalis Pedis:] Ankle: Posterior Tibial: 220 [Left:Posterior Tibial: 220] Toe Nail Assessment Left: Right:  Thick: No Yes Discolored: No Yes Deformed: Yes Yes Improper Length and Hygiene: No Yes Notes Patient DP bilateral fades in and out. PT greater than 220. Patient Non-compressible. Electronic Signature(s) Signed: 12/14/2018 5:12:46 PM By: Gretta Cool, BSN, RN, CWS, Kim RN, BSN Entered By: Gretta Cool, BSN, RN, CWS, Kim on 12/14/2018 09:12:21 ZAIDIN, BLYDEN ([id number], RINCK ([id number]) -------------------------------------------------------------------------------- Multi Wound Chart Details Patient Name: Gary Cruz Date of Service: 12/14/2018 8:45 AM Medical Record Number: [id number] Patient Account Number: [id number] Date of Birth/Sex: 1941/04/06 (77 y.o. M) Treating RN: Harold Barban Primary Care Sophiagrace Benbrook: Vincente Liberty Other Clinician: Referring June Rode: Vincente Liberty Treating Anddy Wingert/Extender: STONE III, HOYT Weeks in Treatment: 0 Vital Signs Height(in): 34 Pulse(bpm): 80 Weight(lbs): 194 Blood Pressure(mmHg): 155/67 Body Mass Index(BMI): 30 Temperature(F): 97.6 Respiratory Rate 16 (breaths/min): Photos: [N/A:N/A] Wound Location: Left Lower Leg - Lateral N/A N/A Wounding Event: Gradually Appeared N/A N/A Primary Etiology: Diabetic Wound/Ulcer of the N/A N/A Lower Extremity Secondary Etiology: Venous Leg Ulcer N/A N/A Comorbid History: Glaucoma, Hypertension, N/A N/A Type II Diabetes,  Gout, Osteoarthritis, Neuropathy Date Acquired: 11/30/2018 N/A N/A Weeks of Treatment: 0 N/A N/A Wound Status: Open N/A N/A Measurements L x W x D 3.5x3.7x0.1 N/A N/A (cm) Area (cm) : 10.171 N/A N/A Volume (cm) : 1.017 N/A N/A % Reduction in Area: 0.00% N/A N/A % Reduction in Volume: 0.00% N/A N/A Classification: Grade 2 N/A N/A Exudate Amount: Medium N/A N/A Exudate Type: Serous N/A N/A Exudate Color: amber N/A N/A Wound Margin: Flat and Intact N/A N/A Granulation Amount: Small (1-33%) N/A N/A Granulation Quality: Pink N/A N/A Necrotic Amount: Large (67-100%) N/A N/A Exposed Structures: Fat Layer (Subcutaneous N/A N/A Tissue) Exposed: Yes Fascia: No Tendon: No Groleau, Brayton H. ([id number]) Muscle: No Joint: No Bone: No Epithelialization: None N/A N/A Periwound Skin Texture: Induration: Yes N/A N/A Excoriation: No Callus: No Crepitus: No Rash: No Scarring: No Periwound Skin Moisture: Maceration: No N/A N/A Dry/Scaly: No Periwound Skin Color: Erythema: Yes N/A N/A Atrophie Blanche: No Cyanosis: No Ecchymosis: No Hemosiderin Staining: No Mottled: No Pallor: No Rubor: No Erythema Location: Circumferential N/A N/A Tenderness on Palpation: No N/A N/A Wound Preparation: Ulcer Cleansing: N/A N/A Rinsed/Irrigated with Saline Topical Anesthetic Applied: Other: lidocaine 4% Treatment Notes Electronic Signature(s) Signed: 12/14/2018 10:54:38 AM By: Harold Barban Entered By: Harold Barban on 12/14/2018 09:41:33 Daughtry, Moishy Lemmie Evens ([id number]) -------------------------------------------------------------------------------- [place] Details Patient Name: Gary Cruz Date of Service: 12/14/2018 8:45 AM Medical Record Number: [id number] Patient Account Number: [id number] Date of Birth/Sex: 10/29/1940 (77 y.o. M) Treating RN: Harold Barban Primary Care Mendi Constable: Vincente Liberty Other Clinician: Referring Ekansh Sherk: Vincente Liberty Treating  Brittney Mucha/Extender: Melburn Hake, HOYT Weeks in Treatment: 0 Active Inactive Orientation to the Wound Care Program Nursing Diagnoses: Knowledge deficit related to the wound healing center program Goals: Patient/caregiver will verbalize understanding of the [facility] Program Date Initiated: 12/14/2018 Target Resolution Date: 01/09/2019 Goal Status: Active Interventions: Provide education on orientation to the wound center Notes: Wound/Skin Impairment Nursing Diagnoses: Impaired tissue integrity Goals: Ulcer/skin breakdown will have a volume reduction of 30% by week 4 Date Initiated: 12/14/2018 Target Resolution Date: 01/14/2019 Goal Status: Active Interventions: Assess patient/caregiver ability to obtain necessary supplies Assess patient/caregiver ability to perform ulcer/skin care regimen upon admission and as needed Assess ulceration(s) every visit Provide education on ulcer and skin care Notes: Electronic Signature(s) Signed: 12/14/2018 10:54:38 AM By: Harold Barban Entered By: Harold Barban on 12/14/2018 09:40:47 Hulen, Karanvir Lemmie Evens ([id number]) -------------------------------------------------------------------------------- Pain Assessment Details Patient Name:  Gary Cruz Date of Service: 12/14/2018 8:45 AM Medical Record Number: [id number] Patient Account Number: [id number] Date of Birth/Sex: 05-Nov-1940 (77 y.o. M) Treating RN: Cornell Barman Primary Care Irania Durell: Vincente Liberty Other Clinician: Referring Avigail Pilling: Vincente Liberty Treating Cliffton Spradley/Extender: Melburn Hake, HOYT Weeks in Treatment: 0 Active Problems Location of Pain Severity and Description of Pain Patient Has Paino Yes Site Locations Pain Location: Pain in Ulcers Duration of the Pain. Constant / Intermittento Constant Rate the pain. Current Pain Level: 6 Character of Pain Describe the Pain: Burning Pain Management and Medication Current Pain Management: Electronic Signature(s) Signed:  12/14/2018 5:12:46 PM By: Gretta Cool, BSN, RN, CWS, Kim RN, BSN Entered By: Gretta Cool, BSN, RN, CWS, Kim on 12/14/2018 08:47:39 Gary Cruz ([id number]) -------------------------------------------------------------------------------- Patient/Caregiver Education Details Patient Name: Gary Cruz Date of Service: 12/14/2018 8:45 AM Medical Record Number: [id number] Patient Account Number: [id number] Date of Birth/Gender: 1941/02/02 (77 y.o. M) Treating RN: Harold Barban Primary Care Physician: Vincente Liberty Other Clinician: Referring Physician: Vincente Liberty Treating Physician/Extender: Sharalyn Ink in Treatment: 0 Education Assessment Education Provided To: Patient Education Topics Provided Welcome To The [facility]: Handouts: Welcome To [facility] Methods: Demonstration, Explain/Verbal Responses: State content correctly Wound/Skin Impairment: Handouts: Caring for Your Ulcer Methods: Demonstration, Explain/Verbal Responses: State content correctly Electronic Signature(s) Signed: 12/14/2018 10:54:38 AM By: Harold Barban Entered By: Harold Barban on 12/14/2018 09:41:58 Beste, Keano Lemmie Evens ([id number]) -------------------------------------------------------------------------------- Wound Assessment Details Patient Name: Gary Cruz Date of Service: 12/14/2018 8:45 AM Medical Record Number: [id number] Patient Account Number: [id number] Date of Birth/Sex: Jul 14, 1941 (77 y.o. M) Treating RN: Cornell Barman Primary Care Lc Joynt: Vincente Liberty Other Clinician: Referring Lexy Meininger: Vincente Liberty Treating Attikus Bartoszek/Extender: Melburn Hake, HOYT Weeks in Treatment: 0 Wound Status Wound Number: 1 Primary Diabetic Wound/Ulcer of the Lower Extremity Etiology: Wound Location: Left Lower Leg - Lateral Secondary Venous Leg Ulcer Wounding Event: Gradually Appeared Etiology: Date Acquired: 11/30/2018 Wound Status: Open Weeks Of Treatment: 0 Comorbid  Glaucoma, Hypertension, Type II Diabetes, Clustered Wound: No History: Gout, Osteoarthritis, Neuropathy Photos Photo Uploaded By: Gretta Cool, BSN, RN, CWS, Kim on 12/14/2018 09:18:45 Wound Measurements Length: (cm) 3.5 Width: (cm) 3.7 Depth: (cm) 0.1 Area: (cm) 10.171 Volume: (cm) 1.017 % Reduction in Area: 0% % Reduction in Volume: 0% Epithelialization: None Tunneling: No Undermining: No Wound Description Classification: Grade 2 Foul Odor Wound Margin: Flat and Intact Slough/Fi Exudate Amount: Medium Exudate Type: Serous Exudate Color: amber After Cleansing: No brino Yes Wound Bed Granulation Amount: Small (1-33%) Exposed Structure Granulation Quality: Pink Fascia Exposed: No Necrotic Amount: Large (67-100%) Fat Layer (Subcutaneous Tissue) Exposed: Yes Necrotic Quality: Adherent Slough Tendon Exposed: No Muscle Exposed: No Joint Exposed: No Bone Exposed: No Periwound Skin Texture Kirkendoll, Letcher H. ([id number]) Texture Color No Abnormalities Noted: No No Abnormalities Noted: No Callus: No Atrophie Blanche: No Crepitus: No Cyanosis: No Excoriation: No Ecchymosis: No Induration: Yes Erythema: Yes Rash: No Erythema Location: Circumferential Scarring: No Hemosiderin Staining: No Mottled: No Moisture Pallor: No No Abnormalities Noted: No Rubor: No Dry / Scaly: No Maceration: No Wound Preparation Ulcer Cleansing: Rinsed/Irrigated with Saline Topical Anesthetic Applied: Other: lidocaine 4%, Treatment Notes Wound #1 (Left, Lateral Lower Leg) Notes silver cell, kerramax, conform,tubigrip Electronic Signature(s) Signed: 12/14/2018 5:12:46 PM By: Gretta Cool, BSN, RN, CWS, Kim RN, BSN Entered By: Gretta Cool, BSN, RN, CWS, Kim on 12/14/2018 09:02:36 Gary Cruz ([id number]) -------------------------------------------------------------------------------- Vitals Details Patient Name: Gary Cruz Date of Service: 12/14/2018 8:45 AM Medical Record Number:  [id number] Patient Account Number:  [id number] Date of Birth/Sex: [birth year] (78 y.o. M) Treating RN: Cornell Barman Primary Care D[place] Ament: Vincente Liberty Other Clinician: Referring Kazuko Clemence: Vincente Liberty Treating Jerret Mcbane/Extender: Melburn Hake, HOYT Weeks in Treatment: 0 Vital Signs Time Taken: 08:47 Temperature (F): 97.6 Height (in): 67 Pulse (bpm): 80 Weight (lbs): 194 Respiratory Rate (breaths/min): 16 Source: Measured Blood Pressure (mmHg): 155/67 Body Mass Index (BMI): 30.4 Reference Range: 80 - 120 mg / dl Electronic Signature(s) Signed: 12/14/2018 5:12:46 PM By: Gretta Cool, BSN, RN, CWS, Kim RN, BSN Entered By: Gretta Cool, BSN, RN, CWS, Kim on 12/14/2018 08:48:21

## 2018-12-15 NOTE — Progress Notes (Signed)
Gary Cruz, Gary Cruz ([id number]) Visit Report for 12/14/2018 Abuse/Suicide Risk Screen Details Patient Name: Gary Cruz, Gary Cruz Date of Service: 12/14/2018 8:45 AM Medical Record Number: [id number] Patient Account Number: [id number] Date of Birth/Sex: 19-Mar-1941 (77 y.o. M) Treating RN: Gary Cruz Primary Care Gary Cruz: Gary Cruz Other Clinician: Referring Gary Cruz: Gary Cruz Treating Gary Cruz/Extender: Gary Cruz, Gary Cruz in Treatment: 0 Abuse/Suicide Risk Screen Items Answer ABUSE/SUICIDE RISK SCREEN: Has anyone close to you tried to hurt or harm you recentlyo No Do you feel uncomfortable with anyone in your familyo No Has anyone forced you do things that you didnot want to doo No Do you have any thoughts of harming yourselfo No Patient displays signs or symptoms of abuse and/or neglect. No Electronic Signature(s) Signed: 12/14/2018 5:12:46 PM By: Gary Cruz, BSN, RN, CWS, Kim RN, BSN Entered By: Gary Cruz, BSN, RN, CWS, Gary Cruz on 12/14/2018 08:56:20 Gary Cruz ([id number]) -------------------------------------------------------------------------------- Activities of Daily Living Details Patient Name: Gary Cruz Date of Service: 12/14/2018 8:45 AM Medical Record Number: [id number] Patient Account Number: [id number] Date of Birth/Sex: Dec 30, 1940 (77 y.o. M) Treating RN: Gary Cruz Primary Care Gary Cruz: Gary Cruz Other Clinician: Referring Gary Cruz: Gary Cruz Treating Gary Cruz/Extender: Gary Cruz, Gary Cruz in Treatment: 0 Activities of Daily Living Items Answer Activities of Daily Living (Please select one for each item) Drive Automobile Completely Able Take Medications Completely Able Use Telephone Completely Able Care for Appearance Completely Able Use Toilet Completely Able Bath / Shower Completely Able Dress Self Completely Able Feed Self Completely Able Walk Completely Able Get In / Out Bed Completely Able Housework Completely Able Prepare Meals  Completely [place] for Self Completely Able Electronic Signature(s) Signed: 12/14/2018 5:12:46 PM By: Gary Cruz, BSN, RN, CWS, Kim RN, BSN Entered By: Gary Cruz, BSN, RN, CWS, Gary Cruz on 12/14/2018 08:56:30 Gary Cruz ([id number]) -------------------------------------------------------------------------------- Education Assessment Details Patient Name: Gary Cruz Date of Service: 12/14/2018 8:45 AM Medical Record Number: [id number] Patient Account Number: [id number] Date of Birth/Sex: 1941-04-04 (77 y.o. M) Treating RN: Gary Cruz Primary Care Elisheva Fallas: Gary Cruz Other Clinician: Referring Gary Cruz: Gary Cruz Treating Gary Cruz/Extender: Gary Cruz, Gary Cruz in Treatment: 0 Primary Learner Assessed: Patient Learning Preferences/Education Level/Primary Language Learning Preference: Explanation, Demonstration Highest Education Level: College or Above Preferred Language: English Cognitive Barrier Assessment/Beliefs Language Barrier: No Translator Needed: No Memory Deficit: No Emotional Barrier: No Cultural/Religious Beliefs Affecting Medical Care: No Physical Barrier Assessment Impaired Vision: No Impaired Hearing: No Decreased Hand dexterity: No Knowledge/Comprehension Assessment Knowledge Level: High Comprehension Level: High Ability to understand written High instructions: Ability to understand verbal High instructions: Motivation Assessment Anxiety Level: Calm Cooperation: Cooperative Education Importance: Acknowledges Need Interest in Health Problems: Asks Questions Perception: Coherent Willingness to Engage in Self- High Management Activities: Readiness to Engage in Self- High Management Activities: Electronic Signature(s) Signed: 12/14/2018 5:12:46 PM By: Gary Cruz, BSN, RN, CWS, Kim RN, BSN Entered By: Gary Cruz, BSN, RN, CWS, Gary Cruz on 12/14/2018 08:56:56 Gary Cruz  ([id number]) -------------------------------------------------------------------------------- Fall Risk Assessment Details Patient Name: Gary Cruz Date of Service: 12/14/2018 8:45 AM Medical Record Number: [id number] Patient Account Number: [id number] Date of Birth/Sex: 09/30/1941 (77 y.o. M) Treating RN: Gary Cruz Primary Care Gary Cruz: Gary Cruz Other Clinician: Referring Gary Cruz: Gary Cruz Treating Gary Cruz/Extender: Gary Cruz, Gary Cruz in Treatment: 0 Fall Risk Assessment Items Have you had 2 or more falls in the last 12 monthso 0 Yes Have you had any fall that resulted in injury in the last 12 monthso 0 No FALL RISK ASSESSMENT: History  of falling - immediate or within 3 months 0 No Secondary diagnosis 0 No Ambulatory aid None/bed rest/wheelchair/nurse 0 No Crutches/cane/walker 15 Yes Furniture 0 No IV Access/Saline Lock 0 No Gait/Training Normal/bed rest/immobile 0 Yes Weak 10 Yes Impaired 0 No Mental Status Oriented to own ability 0 Yes Electronic Signature(s) Signed: 12/14/2018 5:12:46 PM By: Gary Cruz, BSN, RN, CWS, Kim RN, BSN Entered By: Gary Cruz, BSN, RN, CWS, Gary Cruz on 12/14/2018 08:57:41 Gary Cruz ([id number]) -------------------------------------------------------------------------------- Foot Assessment Details Patient Name: Gary Cruz Date of Service: 12/14/2018 8:45 AM Medical Record Number: [id number] Patient Account Number: [id number] Date of Birth/Sex: 10/09/1940 (77 y.o. M) Treating RN: Gary Cruz Primary Care Gary Cruz: Gary Cruz Other Clinician: Referring Gary Cruz: Gary Cruz Treating Gary Cruz/Extender: Gary Cruz, Gary Cruz in Treatment: 0 Foot Assessment Items Site Locations + = Sensation present, - = Sensation absent, C = Callus, U = Ulcer R = Redness, W = Warmth, M = Maceration, PU = Pre-ulcerative lesion F = Fissure, S = Swelling, D = Dryness Assessment Right: Left: Other Deformity: No No Prior Foot  Ulcer: No No Prior Amputation: No No Charcot Joint: No No Ambulatory Status: Ambulatory With Help Assistance Device: Walker Gait: Steady Notes Patient states he uses walker when walking long distances. Electronic Signature(s) Signed: 12/14/2018 5:12:46 PM By: Gary Cruz, BSN, RN, CWS, Kim RN, BSN Entered By: Gary Cruz, BSN, RN, CWS, Gary Cruz on 12/14/2018 09:00:46 Gary Cruz ([id number]) -------------------------------------------------------------------------------- Nutrition Risk Assessment Details Patient Name: Gary Cruz Date of Service: 12/14/2018 8:45 AM Medical Record Number: [id number] Patient Account Number: [id number] Date of Birth/Sex: 01/23/1941 (77 y.o. M) Treating RN: Gary Cruz Primary Care Anupama Piehl: Gary Cruz Other Clinician: Referring Kynley Metzger: Gary Cruz Treating Rosa Wyly/Extender: Gary Cruz, Gary Cruz in Treatment: 0 Height (in): 67 Weight (lbs): 194 Body Mass Index (BMI): 30.4 Nutrition Risk Assessment Items NUTRITION RISK SCREEN: I have an illness or condition that made me change the kind and/or amount of 0 No food I eat I eat fewer than two meals per day 0 No I eat few fruits and vegetables, or milk products 0 No I have three or more drinks of beer, liquor or wine almost every day 0 No I have tooth or mouth problems that make it hard for me to eat 0 No I don't always have enough money to buy the food I need 0 No I eat alone most of the time 0 No I take three or more different prescribed or over-the-counter drugs a day 1 Yes Without wanting to, I have lost or gained 10 pounds in the last six months 0 No I am not always physically able to shop, cook and/or feed myself 0 No Nutrition Protocols Good Risk Protocol 0 No interventions needed Moderate Risk Protocol Electronic Signature(s) Signed: 12/14/2018 5:12:46 PM By: Gary Cruz, BSN, RN, CWS, Kim RN, BSN Entered By: Gary Cruz, BSN, RN, CWS, Gary Cruz on 12/14/2018 08:58:00

## 2018-12-21 ENCOUNTER — Other Ambulatory Visit: Payer: Self-pay

## 2018-12-21 ENCOUNTER — Encounter: Payer: Medicare Other | Admitting: Physician Assistant

## 2018-12-21 DIAGNOSIS — L97822 Non-pressure chronic ulcer of other part of left lower leg with fat layer exposed: Secondary | ICD-10-CM | POA: Diagnosis not present

## 2018-12-21 DIAGNOSIS — E1122 Type 2 diabetes mellitus with diabetic chronic kidney disease: Secondary | ICD-10-CM | POA: Diagnosis not present

## 2018-12-21 DIAGNOSIS — I129 Hypertensive chronic kidney disease with stage 1 through stage 4 chronic kidney disease, or unspecified chronic kidney disease: Secondary | ICD-10-CM | POA: Diagnosis not present

## 2018-12-21 DIAGNOSIS — I872 Venous insufficiency (chronic) (peripheral): Secondary | ICD-10-CM | POA: Diagnosis not present

## 2018-12-21 DIAGNOSIS — E11622 Type 2 diabetes mellitus with other skin ulcer: Secondary | ICD-10-CM | POA: Diagnosis not present

## 2018-12-21 DIAGNOSIS — N183 Chronic kidney disease, stage 3 (moderate): Secondary | ICD-10-CM | POA: Diagnosis not present

## 2018-12-22 NOTE — Progress Notes (Signed)
Gary Cruz, Gary Cruz ([id number]) Visit Report for 12/21/2018 Arrival Information Details Patient Name: Gary Cruz Date of Service: 12/21/2018 9:00 AM Medical Record Number: [id number] Patient Account Number: [id number] Date of Birth/Sex: 07/06/1941 (78 y.o. M) Treating RN: Gary Cruz Primary Care Marirose Deveney: Gary Cruz Other Clinician: Referring Gary Cruz: Gary Cruz Treating Kyl Givler/Extender: Gary Cruz Weeks in Treatment: 1 Visit Information History Since Last Visit Added or deleted any medications: No Patient Arrived: Gary Cruz Any new allergies or adverse reactions: No Arrival Time: 08:57 Had a fall or experienced change in No Accompanied By: self activities of daily living that may affect Transfer Assistance: None risk of falls: Patient Identification Verified: Yes Signs or symptoms of abuse/neglect since last visito No Secondary Verification Process Completed: Yes Hospitalized since last visit: No Patient Has Alerts: Yes Implantable device outside of the clinic excluding No Patient Alerts: DMII cellular tissue based products placed in the center since last visit: Has Dressing in Place as Prescribed: Yes Has Compression in Place as Prescribed: Yes Pain Present Now: No Electronic Signature(s) Signed: 12/21/2018 4:22:24 PM By: Gary Cruz Entered By: Gary Cruz on 12/21/2018 08:58:04 Gary Cruz ([id number]) -------------------------------------------------------------------------------- Clinic Level of Care Assessment Details Patient Name: Gary Cruz Date of Service: 12/21/2018 9:00 AM Medical Record Number: [id number] Patient Account Number: [id number] Date of Birth/Sex: 09-Mar-1941 (78 y.o. M) Treating RN: Gary Cruz Primary Care Sirena Riddle: Gary Cruz Other Clinician: Referring Jaleisa Brose: Gary Cruz Treating Roger Fasnacht/Extender: Gary Cruz Weeks in Treatment: 1 Clinic Level of Care Assessment Items TOOL 4 Quantity  Score []  - Use when only an EandM is performed on FOLLOW-UP visit 0 ASSESSMENTS - Nursing Assessment / Reassessment X - Reassessment of Co-morbidities (includes updates in patient status) 1 10 X- 1 5 Reassessment of Adherence to Treatment Plan ASSESSMENTS - Wound and Skin Assessment / Reassessment X - Simple Wound Assessment / Reassessment - one wound 1 5 []  - 0 Complex Wound Assessment / Reassessment - multiple wounds []  - 0 Dermatologic / Skin Assessment (not related to wound area) ASSESSMENTS - Focused Assessment []  - Circumferential Edema Measurements - multi extremities 0 []  - 0 Nutritional Assessment / Counseling / Intervention []  - 0 Lower Extremity Assessment (monofilament, tuning fork, pulses) []  - 0 Peripheral Arterial Disease Assessment (using hand held doppler) ASSESSMENTS - Ostomy and/or Continence Assessment and Care []  - Incontinence Assessment and Management 0 []  - 0 Ostomy Care Assessment and Management (repouching, etc.) PROCESS - Coordination of Care X - Simple Patient / Family Education for ongoing care 1 15 []  - 0 Complex (extensive) Patient / Family Education for ongoing care []  - 0 Staff obtains Programmer, systems, Records, Test Results / Process Orders []  - 0 Staff telephones HHA, Nursing Homes / Clarify orders / etc []  - 0 Routine Transfer to another Facility (non-emergent condition) []  - 0 Routine Hospital Admission (non-emergent condition) []  - 0 New Admissions / Biomedical engineer / Ordering NPWT, Apligraf, etc. []  - 0 Emergency Hospital Admission (emergent condition) X- 1 10 Simple Discharge Coordination [name], Cruz. ([id number]) []  - 0 Complex (extensive) Discharge Coordination PROCESS - Special Needs []  - Pediatric / Minor Patient Management 0 []  - 0 Isolation Patient Management []  - 0 Hearing / Language / Visual special needs []  - 0 Assessment of Community assistance (transportation, D/C planning, etc.) []  - 0 Additional assistance /  Altered mentation []  - 0 Support Surface(s) Assessment (bed, cushion, seat, etc.) INTERVENTIONS - Wound Cleansing / Measurement X - Simple Wound Cleansing - one wound 1 5 []  -  0 Complex Wound Cleansing - multiple wounds X- 1 5 Wound Imaging (photographs - any number of wounds) []  - 0 Wound Tracing (instead of photographs) X- 1 5 Simple Wound Measurement - one wound []  - 0 Complex Wound Measurement - multiple wounds INTERVENTIONS - Wound Dressings X - Small Wound Dressing one or multiple wounds 1 10 []  - 0 Medium Wound Dressing one or multiple wounds []  - 0 Large Wound Dressing one or multiple wounds []  - 0 Application of Medications - topical []  - 0 Application of Medications - injection INTERVENTIONS - Miscellaneous []  - External ear exam 0 []  - 0 Specimen Collection (cultures, biopsies, blood, body fluids, etc.) []  - 0 Specimen(s) / Culture(s) sent or taken to Lab for analysis []  - 0 Patient Transfer (multiple staff / Civil Service fast streamer / Similar devices) []  - 0 Simple Staple / Suture removal (25 or less) []  - 0 Complex Staple / Suture removal (26 or more) []  - 0 Hypo / Hyperglycemic Management (close monitor of Blood Glucose) []  - 0 Ankle / Brachial Index (ABI) - do not check if billed separately X- 1 5 Vital Signs [name], [name] ([id number]) Has the patient been seen at the hospital within the last three years: Yes Total Score: 75 Level Of Care: New/Established - Level 2 Electronic Signature(s) Signed: 12/21/2018 5:03:00 PM By: Gary Cruz Entered By: Gary Cruz on 12/21/2018 09:29:13 Gary Cruz ([id number]) -------------------------------------------------------------------------------- Encounter Discharge Information Details Patient Name: Gary Cruz Date of Service: 12/21/2018 9:00 AM Medical Record Number: [id number] Patient Account Number: [id number] Date of Birth/Sex: 1941-03-31 (78 y.o. M) Treating RN: Gary Cruz Primary Care Gracemarie Skeet:  Gary Cruz Other Clinician: Referring Davone Shinault: Gary Cruz Treating Zahli Vetsch/Extender: Gary Cruz Weeks in Treatment: 1 Encounter Discharge Information Items Discharge Condition: Stable Ambulatory Status: Gary Cruz Discharge Destination: Home Transportation: Other Accompanied By: self Schedule Follow-up Appointment: Yes Clinical Summary of Care: Electronic Signature(s) Signed: 12/21/2018 10:05:08 AM By: Gary Cruz Entered By: Gary Cruz on 12/21/2018 09:48:27 Tremain, Joesiah Lemmie Cruz ([id number]) -------------------------------------------------------------------------------- Lower Extremity Assessment Details Patient Name: Gary Cruz Date of Service: 12/21/2018 9:00 AM Medical Record Number: [id number] Patient Account Number: [id number] Date of Birth/Sex: 1940-11-20 (78 y.o. M) Treating RN: Gary Cruz Primary Care Jordynne Mccown: Gary Cruz Other Clinician: Referring Carlyn Mullenbach: Gary Cruz Treating Amirrah Quigley/Extender: STONE III, Cruz Weeks in Treatment: 1 Edema Assessment Assessed: [Left: No] [Right: No] Edema: [Left: Ye] [Right: s] Vascular Assessment Pulses: Dorsalis Pedis Palpable: [Left:Yes] Posterior Tibial Extremity colors, hair growth, and conditions: Extremity Color: [Left:Normal] Hair Growth on Extremity: [Left:No] Temperature of Extremity: [Left:Warm] Capillary Refill: [Left:< 3 seconds] Toe Nail Assessment Left: Right: Thick: Yes Discolored: Yes Deformed: Yes Improper Length and Hygiene: No Electronic Signature(s) Signed: 12/21/2018 4:22:24 PM By: Gary Cruz Entered By: Gary Cruz on 12/21/2018 09:07:38 Pint, Jamorian Lemmie Cruz ([id number]) -------------------------------------------------------------------------------- Multi Wound Chart Details Patient Name: Gary Cruz Date of Service: 12/21/2018 9:00 AM Medical Record Number: [id number] Patient Account Number: [id number] Date of Birth/Sex: 03/30/1941 (78 y.o. M) Treating RN:  Gary Cruz Primary Care Finnleigh Marchetti: Gary Cruz Other Clinician: Referring Christipher Rieger: Gary Cruz Treating Geniece Akers/Extender: STONE III, Cruz Weeks in Treatment: 1 Vital Signs Height(in): 59 Pulse(bpm): 53 Weight(lbs): 194 Blood Pressure(mmHg): 114/42 Body Mass Index(BMI): 30 Temperature(F): 97.8 Respiratory Rate 16 (breaths/min): Photos: [N/A:N/A] Wound Location: Left Lower Leg - Lateral N/A N/A Wounding Event: Gradually Appeared N/A N/A Primary Etiology: Diabetic Wound/Ulcer of the N/A N/A Lower Extremity Secondary Etiology: Venous Leg Ulcer N/A N/A Comorbid History: Glaucoma, Hypertension, N/A N/A Type II Diabetes,  Gout, Osteoarthritis, Neuropathy Date Acquired: 11/30/2018 N/A N/A Weeks of Treatment: 1 N/A N/A Wound Status: Open N/A N/A Measurements L x W x D 3x2.8x0.1 N/A N/A (cm) Area (cm) : 6.597 N/A N/A Volume (cm) : 0.66 N/A N/A % Reduction in Area: 35.10% N/A N/A % Reduction in Volume: 35.10% N/A N/A Classification: Grade 2 N/A N/A Exudate Amount: Medium N/A N/A Exudate Type: Serous N/A N/A Exudate Color: amber N/A N/A Wound Margin: Flat and Intact N/A N/A Granulation Amount: Large (67-100%) N/A N/A Granulation Quality: Pink N/A N/A Necrotic Amount: Small (1-33%) N/A N/A Necrotic Tissue: Eschar, Adherent Slough N/A N/A Exposed Structures: Fat Layer (Subcutaneous N/A N/A Tissue) Exposed: Yes Fascia: No Oriol, Andri H. ([id number]) Tendon: No Muscle: No Joint: No Bone: No Epithelialization: None N/A N/A Periwound Skin Texture: Induration: Yes N/A N/A Excoriation: No Callus: No Crepitus: No Rash: No Scarring: No Periwound Skin Moisture: Maceration: No N/A N/A Dry/Scaly: No Periwound Skin Color: Erythema: Yes N/A N/A Atrophie Blanche: No Cyanosis: No Ecchymosis: No Hemosiderin Staining: No Mottled: No Pallor: No Rubor: No Erythema Location: Circumferential N/A N/A Temperature: No Abnormality N/A N/A Tenderness on  Palpation: Yes N/A N/A Wound Preparation: Ulcer Cleansing: N/A N/A Rinsed/Irrigated with Saline Topical Anesthetic Applied: Other: lidocaine 4% Treatment Notes Electronic Signature(s) Signed: 12/21/2018 5:03:00 PM By: Gary Cruz Entered By: Gary Cruz on 12/21/2018 09:27:26 Gary Cruz ([id number]) -------------------------------------------------------------------------------- [place] Details Patient Name: Gary Cruz Date of Service: 12/21/2018 9:00 AM Medical Record Number: [id number] Patient Account Number: [id number] Date of Birth/Sex: [birth year] (78 y.o. M) Treating RN: Gary Cruz Primary Care Kirsten Mckone: Gary Cruz Other Clinician: Referring Deirdra Heumann: Gary Cruz Treating Daxtyn Rottenberg/Extender: Gary Cruz Weeks in Treatment: 1 Active Inactive Orientation to the Wound Care Program Nursing Diagnoses: Knowledge deficit related to the wound healing center program Goals: Patient/caregiver will verbalize understanding of the [facility] Date Initiated: 12/14/2018 Target Resolution Date: 01/09/2019 Goal Status: Active Interventions: Provide education on orientation to the wound center Notes: Wound/Skin Impairment Nursing Diagnoses: Impaired tissue integrity Goals: Ulcer/skin breakdown will have a volume reduction of 30% by week 4 Date Initiated: 12/14/2018 Target Resolution Date: 01/14/2019 Goal Status: Active Interventions: Assess patient/caregiver ability to obtain necessary supplies Assess patient/caregiver ability to perform ulcer/skin care regimen upon admission and as needed Assess ulceration(s) every visit Provide education on ulcer and skin care Notes: Electronic Signature(s) Signed: 12/21/2018 5:03:00 PM By: Gary Cruz Entered By: Gary Cruz on 12/21/2018 09:26:58 Serpa, Sondra Cruz ([id number]) -------------------------------------------------------------------------------- Pain  Assessment Details Patient Name: Gary Cruz Date of Service: 12/21/2018 9:00 AM Medical Record Number: [id number] Patient Account Number: [id number] Date of Birth/Sex: 07/17/1941 (78 y.o. M) Treating RN: Gary Cruz Primary Care Todd Jelinski: Gary Cruz Other Clinician: Referring Jayce Kainz: Gary Cruz Treating Taela Charbonneau/Extender: Gary Cruz Weeks in Treatment: 1 Active Problems Location of Pain Severity and Description of Pain Patient Has Paino Yes Site Locations Pain Location: Pain in Ulcers With Dressing Change: Yes Duration of the Pain. Constant / Intermittento Constant Character of Pain Describe the Pain: Burning Pain Management and Medication Current Pain Management: Electronic Signature(s) Signed: 12/21/2018 4:22:24 PM By: Gary Cruz Entered By: Gary Cruz on 12/21/2018 08:58:15 Kovatch, Enrico Lemmie Cruz ([id number]) -------------------------------------------------------------------------------- Patient/Caregiver Education Details Patient Name: Gary Cruz Date of Service: 12/21/2018 9:00 AM Medical Record Number: [id number] Patient Account Number: [id number] Date of Birth/Gender: 02-11-1941 (78 y.o. M) Treating RN: Gary Cruz Primary Care Physician: Gary Cruz Other Clinician: Referring Physician: Vincente Cruz Treating Physician/Extender: STONE III, Cruz Weeks in Treatment:  1 Education Assessment Education Provided To: Patient Education Topics Provided Wound/Skin Impairment: Handouts: Caring for Your Ulcer Methods: Demonstration, Explain/Verbal Responses: State content correctly Electronic Signature(s) Signed: 12/21/2018 5:03:00 PM By: Gary Cruz Entered By: Gary Cruz on 12/21/2018 09:27:40 Jaye, Tajh Lemmie Cruz ([id number]) -------------------------------------------------------------------------------- Wound Assessment Details Patient Name: Gary Cruz Date of Service: 12/21/2018 9:00 AM Medical Record Number:  [id number] Patient Account Number: [id number] Date of Birth/Sex: 1940-11-09 (78 y.o. M) Treating RN: Gary Cruz Primary Care Dino Borntreger: Gary Cruz Other Clinician: Referring Melida Northington: Gary Cruz Treating Skylar Flynt/Extender: STONE III, Cruz Weeks in Treatment: 1 Wound Status Wound Number: 1 Primary Diabetic Wound/Ulcer of the Lower Extremity Etiology: Wound Location: Left Lower Leg - Lateral Secondary Venous Leg Ulcer Wounding Event: Gradually Appeared Etiology: Date Acquired: 11/30/2018 Wound Status: Open Weeks Of Treatment: 1 Comorbid Glaucoma, Hypertension, Type II Diabetes, Clustered Wound: No History: Gout, Osteoarthritis, Neuropathy Photos Photo Uploaded By: Gary Cruz on 12/21/2018 09:10:14 Wound Measurements Length: (cm) 3 Width: (cm) 2.8 Depth: (cm) 0.1 Area: (cm) 6.597 Volume: (cm) 0.66 % Reduction in Area: 35.1% % Reduction in Volume: 35.1% Epithelialization: None Tunneling: No Undermining: No Wound Description Classification: Grade 2 Wound Margin: Flat and Intact Exudate Amount: Medium Exudate Type: Serous Exudate Color: amber Foul Odor After Cleansing: No Slough/Fibrino Yes Wound Bed Granulation Amount: Large (67-100%) Exposed Structure Granulation Quality: Pink Fascia Exposed: No Necrotic Amount: Small (1-33%) Fat Layer (Subcutaneous Tissue) Exposed: Yes Necrotic Quality: Eschar, Adherent Slough Tendon Exposed: No Muscle Exposed: No Joint Exposed: No Bone Exposed: No Periwound Skin Texture Marasco, Nico H. ([id number]) Texture Color No Abnormalities Noted: No No Abnormalities Noted: No Callus: No Atrophie Blanche: No Crepitus: No Cyanosis: No Excoriation: No Ecchymosis: No Induration: Yes Erythema: Yes Rash: No Erythema Location: Circumferential Scarring: No Hemosiderin Staining: No Mottled: No Moisture Pallor: No No Abnormalities Noted: No Rubor: No Dry / Scaly: No Maceration: No Temperature /  Pain Temperature: No Abnormality Tenderness on Palpation: Yes Wound Preparation Ulcer Cleansing: Rinsed/Irrigated with Saline Topical Anesthetic Applied: Other: lidocaine 4%, Treatment Notes Wound #1 (Left, Lateral Lower Leg) Notes silver cell, xtrasorb, conform,tubigrip Electronic Signature(s) Signed: 12/21/2018 4:22:24 PM By: Gary Cruz Entered By: Gary Cruz on 12/21/2018 09:07:12 Brunker, Mikail Lemmie Cruz ([id number]) -------------------------------------------------------------------------------- Vitals Details Patient Name: Gary Cruz Date of Service: 12/21/2018 9:00 AM Medical Record Number: [id number] Patient Account Number: [id number] Date of Birth/Sex: [birth year] (78 y.o. M) Treating RN: Gary Cruz Primary Care Haiven Nardone: Gary Cruz Other Clinician: Referring Jilliane Kazanjian: Gary Cruz Treating Keandria Berrocal/Extender: Gary Cruz Weeks in Treatment: 1 Vital Signs Time Taken: 09:00 Temperature (F): 97.8 Height (in): 67 Pulse (bpm): 65 Weight (lbs): 194 Respiratory Rate (breaths/min): 16 Body Mass Index (BMI): 30.4 Blood Pressure (mmHg): 114/42 Reference Range: 80 - 120 mg / dl Notes BP 96/40 on recheck 114/42, Margarita Grizzle notified Electronic Signature(s) Signed: 12/21/2018 4:22:24 PM By: Gary Cruz Entered By: Gary Cruz on 12/21/2018 09:03:56

## 2018-12-23 NOTE — Progress Notes (Signed)
LEMONT, SITZMANN ([id number]) Visit Report for 12/21/2018 Chief Complaint Document Details Patient Name: Gary Cruz, Gary Cruz Date of Service: 12/21/2018 9:00 AM Medical Record Number: [id number] Patient Account Number: [id number] Date of Birth/Sex: 1941-04-07 (78 y.o. M) Treating RN: Harold Barban Primary Care Provider: Vincente Liberty Other Clinician: Referring Provider: Vincente Liberty Treating Provider/Extender: Melburn Hake, HOYT Weeks in Treatment: 1 Information Obtained from: Patient Chief Complaint Left LE ulcer Electronic Signature(s) Signed: 12/22/2018 2:10:29 PM By: Worthy Keeler PA-C Entered By: Worthy Keeler on 12/21/2018 08:52:13 Gary Cruz, Gary Cruz ([id number]) -------------------------------------------------------------------------------- HPI Details Patient Name: Gary Cruz Date of Service: 12/21/2018 9:00 AM Medical Record Number: [id number] Patient Account Number: [id number] Date of Birth/Sex: 11/18/1940 (78 y.o. M) Treating RN: Harold Barban Primary Care Provider: Vincente Liberty Other Clinician: Referring Provider: Vincente Liberty Treating Provider/Extender: Melburn Hake, HOYT Weeks in Treatment: 1 History of Present Illness HPI Description: 12/14/18 on evaluation today patient actually appears to be doing somewhat poorly in regard to his left lateral leg where for the past two weeks he's had an ulcer that has been draining and causing some discomfort. He has a history of venous insufficiency, diabetes mellitus type II, chronic kidney disease stage III, and hypertension. He was noncompressible bilaterally and pulses were not palpable at this point. He actually is the patient the [facility] has an appointment with nephrology on March 26. He did have a left hip replacement which he states since that time is had more drainage and swelling as far as the fluid is concerned. Nonetheless he says at this point that this has been given a lot of trouble and  he's struggling to keep things under control as far as that is concerned. It does appear that he really needs compression but again with the unknown vascular status other than the fact that we know he does not seem to have good flow I do not think that we are gonna be able to perform significant compression therapy on him at this point unfortunately. 12/21/18 on evaluation today patient actually appears to be doing better in regard to his left lateral lower Trinity ulcer. He has been tolerating the dressing changes without complication and fortunately there does not appear to be any evidence that there is infection at the site. Overall I feel like he has a lot of new skin growth things do seem to be progressing quite nicely. Electronic Signature(s) Signed: 12/22/2018 2:10:29 PM By: Worthy Keeler PA-C Entered By: Worthy Keeler on 12/22/2018 09:47:27 Gary Cruz, Gary Cruz ([id number]) -------------------------------------------------------------------------------- Physical Exam Details Patient Name: Gary Cruz Date of Service: 12/21/2018 9:00 AM Medical Record Number: [id number] Patient Account Number: [id number] Date of Birth/Sex: 07-03-1941 (78 y.o. M) Treating RN: Harold Barban Primary Care Provider: Vincente Liberty Other Clinician: Referring Provider: Vincente Liberty Treating Provider/Extender: STONE III, HOYT Weeks in Treatment: 1 Constitutional Well-nourished and well-hydrated in no acute distress. Respiratory normal breathing without difficulty. clear to auscultation bilaterally. Cardiovascular regular rate and rhythm with normal S1, S2. Psychiatric this patient is able to make decisions and demonstrates good insight into disease process. Alert and Oriented x 3. pleasant and cooperative. Notes Patient's wound bed currently showed signs of good granulation at this time and again he had excellent epithelialization noted as well. Overall that does not appear to be any evidence  of infection at this point. Electronic Signature(s) Signed: 12/22/2018 2:10:29 PM By: Worthy Keeler PA-C Entered By: Worthy Keeler on 12/22/2018 09:48:27 Gary Cruz, Gary Cruz ([id number]) -------------------------------------------------------------------------------- Physician Orders Details Patient  Name: Gary Cruz Date of Service: 12/21/2018 9:00 AM Medical Record Number: [id number] Patient Account Number: [id number] Date of Birth/Sex: [birth year] (78 y.o. M) Treating RN: Harold Barban Primary Care Provider: Vincente Liberty Other Clinician: Referring Provider: Vincente Liberty Treating Provider/Extender: Melburn Hake, HOYT Weeks in Treatment: 1 Verbal / Phone Orders: No Diagnosis Coding ICD-10 Coding Code Description I87.2 Venous insufficiency (chronic) (peripheral) E11.622 Type 2 diabetes mellitus with other skin ulcer L97.822 Non-pressure chronic ulcer of other part of left lower leg with fat layer exposed N18.3 Chronic kidney disease, stage 3 (moderate) I10 Essential (primary) hypertension Wound Cleansing Wound #1 Left,Lateral Lower Leg o May shower with protection. - Do not get dressing wet. If so, Please change to dry clean dressing Primary Wound Dressing Wound #1 Left,Lateral Lower Leg o Silver Alginate Secondary Dressing Wound #1 Left,Lateral Lower Leg o XtraSorb - Conform Dressing Change Frequency Wound #1 Left,Lateral Lower Leg o Other: - Change Dressing Thursday or Friday Follow-up Appointments Wound #1 Left,Lateral Lower Leg o Return Appointment in 1 week. Edema Control Wound #1 Left,Lateral Lower Leg o Other: - Tubi Grip Electronic Signature(s) Signed: 12/21/2018 5:03:00 PM By: Harold Barban Signed: 12/22/2018 2:10:29 PM By: Worthy Keeler PA-C Entered By: Harold Barban on 12/21/2018 09:30:30 Gary Cruz, Gary Cruz ([id number]) -------------------------------------------------------------------------------- Problem List Details Patient Name:  Gary Cruz Date of Service: 12/21/2018 9:00 AM Medical Record Number: [id number] Patient Account Number: [id number] Date of Birth/Sex: 1941/01/12 (78 y.o. M) Treating RN: Harold Barban Primary Care Provider: Vincente Liberty Other Clinician: Referring Provider: Vincente Liberty Treating Provider/Extender: Melburn Hake, HOYT Weeks in Treatment: 1 Active Problems ICD-10 Evaluated Encounter Code Description Active Date Today Diagnosis I87.2 Venous insufficiency (chronic) (peripheral) 12/14/2018 No Yes E11.622 Type 2 diabetes mellitus with other skin ulcer 12/14/2018 No Yes L97.822 Non-pressure chronic ulcer of other part of left lower leg with 12/14/2018 No Yes fat layer exposed N18.3 Chronic kidney disease, stage 3 (moderate) 12/14/2018 No Yes I10 Essential (primary) hypertension 12/14/2018 No Yes Inactive Problems Resolved Problems Electronic Signature(s) Signed: 12/22/2018 2:10:29 PM By: Worthy Keeler PA-C Entered By: Worthy Keeler on 12/21/2018 08:52:09 Gary Cruz, Gary Cruz ([id number]) -------------------------------------------------------------------------------- Progress Note Details Patient Name: Gary Cruz Date of Service: 12/21/2018 9:00 AM Medical Record Number: [id number] Patient Account Number: [id number] Date of Birth/Sex: 10-28-1940 (78 y.o. M) Treating RN: Harold Barban Primary Care Provider: Vincente Liberty Other Clinician: Referring Provider: Vincente Liberty Treating Provider/Extender: Melburn Hake, HOYT Weeks in Treatment: 1 Subjective Chief Complaint Information obtained from Patient Left LE ulcer History of Present Illness (HPI) 12/14/18 on evaluation today patient actually appears to be doing somewhat poorly in regard to his left lateral leg where for the past two weeks he's had an ulcer that has been draining and causing some discomfort. He has a history of venous insufficiency, diabetes mellitus type II, chronic kidney disease stage III, and  hypertension. He was noncompressible bilaterally and pulses were not palpable at this point. He actually is the patient the [facility] has an appointment with nephrology on March 26. He did have a left hip replacement which he states since that time is had more drainage and swelling as far as the fluid is concerned. Nonetheless he says at this point that this has been given a lot of trouble and he's struggling to keep things under control as far as that is concerned. It does appear that he really needs compression but again with the unknown vascular status other than the fact that we know he does not seem  to have good flow I do not think that we are gonna be able to perform significant compression therapy on him at this point unfortunately. 12/21/18 on evaluation today patient actually appears to be doing better in regard to his left lateral lower Trinity ulcer. He has been tolerating the dressing changes without complication and fortunately there does not appear to be any evidence that there is infection at the site. Overall I feel like he has a lot of new skin growth things do seem to be progressing quite nicely. Patient History Information obtained from Patient. Family History Cancer - Father, Diabetes - Mother,Father,Siblings, Heart Disease - Mother,Father, Hypertension - Mother,Father,Siblings, No family history of Kidney Disease, Lung Disease, Seizures, Stroke, Thyroid Problems, Tuberculosis. Social History Never smoker, Marital Status - Married, Alcohol Use - Moderate, Drug Use - No History, Caffeine Use - Daily. Medical History Eyes Patient has history of Glaucoma Denies history of Cataracts, Optic Neuritis Ear/Nose/Mouth/Throat Denies history of Chronic sinus problems/congestion, Middle ear problems Hematologic/Lymphatic Denies history of Anemia, Hemophilia, Human Immunodeficiency Virus, Lymphedema, Sickle Cell Disease Respiratory Denies history of Aspiration, Asthma, Chronic  Obstructive Pulmonary Disease (COPD), Pneumothorax, Sleep Apnea, Tuberculosis Cardiovascular Patient has history of Hypertension Denies history of Angina, Arrhythmia, Congestive Heart Failure, Coronary Artery Disease, Deep Vein Thrombosis, Myocardial Infarction, Peripheral Arterial Disease, Peripheral Venous Disease, Phlebitis, Vasculitis Gastrointestinal Vandeusen, Alexande H. ([id number]) Denies history of Cirrhosis , Colitis, Crohn s, Hepatitis A, Hepatitis B, Hepatitis C Endocrine Patient has history of Type II Diabetes Denies history of Type I Diabetes Genitourinary Denies history of End Stage Renal Disease Immunological Denies history of Lupus Erythematosus, Raynaud s, Scleroderma Integumentary (Skin) Denies history of History of Burn, History of pressure wounds Musculoskeletal Patient has history of Gout, Osteoarthritis Denies history of Rheumatoid Arthritis, Osteomyelitis Neurologic Patient has history of Neuropathy - feet Denies history of Dementia, Quadriplegia, Paraplegia, Seizure Disorder Oncologic Denies history of Received Chemotherapy, Received Radiation Psychiatric Denies history of Anorexia/bulimia, Confinement Anxiety Review of Systems (ROS) Constitutional Symptoms (General Health) Denies complaints or symptoms of Fatigue, Fever, Chills. Respiratory The patient has no complaints or symptoms. Cardiovascular The patient has no complaints or symptoms. Psychiatric The patient has no complaints or symptoms. Objective Constitutional Well-nourished and well-hydrated in no acute distress. Vitals Time Taken: 9:00 AM, Height: 67 in, Weight: 194 lbs, BMI: 30.4, Temperature: 97.8 F, Pulse: 65 bpm, Respiratory Rate: 16 breaths/min, Blood Pressure: 114/42 mmHg. General Notes: BP 96/40 on recheck 114/42, Hoyt notified Respiratory normal breathing without difficulty. clear to auscultation bilaterally. Cardiovascular regular rate and rhythm with normal S1,  S2. Psychiatric this patient is able to make decisions and demonstrates good insight into disease process. Alert and Oriented x 3. pleasant and cooperative. Gary Cruz, Gary Cruz ([id number]) General Notes: Patient's wound bed currently showed signs of good granulation at this time and again he had excellent epithelialization noted as well. Overall that does not appear to be any evidence of infection at this point. Integumentary (Hair, Skin) Wound #1 status is Open. Original cause of wound was Gradually Appeared. The wound is located on the Left,Lateral Lower Leg. The wound measures 3cm length x 2.8cm width x 0.1cm depth; 6.597cm^2 area and 0.66cm^3 volume. There is Fat Layer (Subcutaneous Tissue) Exposed exposed. There is no tunneling or undermining noted. There is a medium amount of serous drainage noted. The wound margin is flat and intact. There is large (67-100%) pink granulation within the wound bed. There is a small (1-33%) amount of necrotic tissue within the wound bed including Eschar and  Adherent Slough. The periwound skin appearance exhibited: Induration, Erythema. The periwound skin appearance did not exhibit: Callus, Crepitus, Excoriation, Rash, Scarring, Dry/Scaly, Maceration, Atrophie Blanche, Cyanosis, Ecchymosis, Hemosiderin Staining, Mottled, Pallor, Rubor. The surrounding wound skin color is noted with erythema which is circumferential. Periwound temperature was noted as No Abnormality. The periwound has tenderness on palpation. Assessment Active Problems ICD-10 Venous insufficiency (chronic) (peripheral) Type 2 diabetes mellitus with other skin ulcer Non-pressure chronic ulcer of other part of left lower leg with fat layer exposed Chronic kidney disease, stage 3 (moderate) Essential (primary) hypertension Plan Wound Cleansing: Wound #1 Left,Lateral Lower Leg: May shower with protection. - Do not get dressing wet. If so, Please change to dry clean dressing Primary Wound  Dressing: Wound #1 Left,Lateral Lower Leg: Silver Alginate Secondary Dressing: Wound #1 Left,Lateral Lower Leg: XtraSorb - Conform Dressing Change Frequency: Wound #1 Left,Lateral Lower Leg: Other: - Change Dressing Thursday or Friday Follow-up Appointments: Wound #1 Left,Lateral Lower Leg: Return Appointment in 1 week. Edema Control: Wound #1 Left,Lateral Lower Leg: Other: - Tubi Grip Gary Cruz, Gary H. ([id number]) I'm gonna suggest at this time that we go ahead and continue with the above wound care measures for the next week patients in agreement with plan. Subsequently we're gonna see were things stand at follow-up. Anything changes or worsens in the meantime patient will contact the office and let me know. Otherwise my suggestion will be that the compression wrap is good to still continue and we will subsequently see where the feeling is come next week. Please see above for specific wound care orders. We will see patient for re-evaluation in 1 week(s) here in the clinic. If anything worsens or changes patient will contact our office for additional recommendations. Electronic Signature(s) Signed: 12/22/2018 2:10:29 PM By: Worthy Keeler PA-C Entered By: Worthy Keeler on 12/22/2018 09:48:41 Gary Cruz, Gary Cruz ([id number]) -------------------------------------------------------------------------------- ROS/PFSH Details Patient Name: Gary Cruz Date of Service: 12/21/2018 9:00 AM Medical Record Number: [id number] Patient Account Number: [id number] Date of Birth/Sex: 09/28/1941 (78 y.o. M) Treating RN: Harold Barban Primary Care Provider: Vincente Liberty Other Clinician: Referring Provider: Vincente Liberty Treating Provider/Extender: Melburn Hake, HOYT Weeks in Treatment: 1 Information Obtained From Patient Wound History Do you currently have one or more open woundso Yes How many open wounds do you currently haveo 1 Approximately how long have you had your woundso 2  weeks How have you been treating your wound(s) until nowo peroxide, soap and water Has your wound(s) ever healed and then re-openedo No Have you had any lab work done in the past montho Yes Who ordered the lab work [place] Have you tested positive for an antibiotic resistant organism (MRSA, VRE)o No Have you tested positive for osteomyelitis (bone infection)o No Have you had any tests for circulation on your legso Yes Who [facility] the [facility] Constitutional Symptoms (General Health) Complaints and Symptoms: Negative for: Fatigue; Fever; Chills Eyes Medical History: Positive for: Glaucoma Negative for: Cataracts; Optic Neuritis Ear/Nose/Mouth/Throat Medical History: Negative for: Chronic sinus problems/congestion; Middle ear problems Hematologic/Lymphatic Medical History: Negative for: Anemia; Hemophilia; Human Immunodeficiency Virus; Lymphedema; Sickle Cell Disease Respiratory Complaints and Symptoms: No Complaints or Symptoms Medical History: Negative for: Aspiration; Asthma; Chronic Obstructive Pulmonary Disease (COPD); Pneumothorax; Sleep Apnea; Tuberculosis Cardiovascular Complaints and Symptoms: No Complaints or Symptoms Gary Cruz, SIPPEL. ([id number]) Medical History: Positive for: Hypertension Negative for: Angina; Arrhythmia; Congestive Heart Failure; Coronary Artery Disease; Deep Vein Thrombosis; Myocardial Infarction; Peripheral Arterial Disease; Peripheral Venous Disease; Phlebitis; Vasculitis Gastrointestinal  Medical History: Negative for: Cirrhosis ; Colitis; Crohnos; Hepatitis A; Hepatitis B; Hepatitis C Endocrine Medical History: Positive for: Type II Diabetes Negative for: Type I Diabetes Time with diabetes: 25 years Treated with: Oral agents Blood sugar tested every day: No Genitourinary Medical History: Negative for: End Stage Renal Disease Immunological Medical History: Negative for: Lupus Erythematosus; Raynaudos;  Scleroderma Integumentary (Skin) Medical History: Negative for: History of Burn; History of pressure wounds Musculoskeletal Medical History: Positive for: Gout; Osteoarthritis Negative for: Rheumatoid Arthritis; Osteomyelitis Neurologic Medical History: Positive for: Neuropathy - feet Negative for: Dementia; Quadriplegia; Paraplegia; Seizure Disorder Oncologic Medical History: Negative for: Received Chemotherapy; Received Radiation Psychiatric Complaints and Symptoms: No Complaints or Symptoms Medical HistoryNICOLAUS, ANDEL ([id number]) Negative for: Anorexia/bulimia; Confinement Anxiety HBO Extended History Items Eyes: Glaucoma Immunizations Pneumococcal Vaccine: Received Pneumococcal Vaccination: Yes Implantable Devices None Family and Social History Cancer: Yes - Father; Diabetes: Yes - Mother,Father,Siblings; Heart Disease: Yes - Mother,Father; Hypertension: Yes - Mother,Father,Siblings; Kidney Disease: No; Lung Disease: No; Seizures: No; Stroke: No; Thyroid Problems: No; Tuberculosis: No; Never smoker; Marital Status - Married; Alcohol Use: Moderate; Drug Use: No History; Caffeine Use: Daily; Advanced Directives: No; Patient does not want information on Advanced Directives; Living Will: No; Medical Power of Attorney: No Physician Affirmation I have reviewed and agree with the above information. Electronic Signature(s) Signed: 12/22/2018 2:10:29 PM By: Worthy Keeler PA-C Signed: 12/22/2018 4:48:10 PM By: Harold Barban Entered By: Worthy Keeler on 12/22/2018 09:47:41 Berroa, Ben Lemmie Cruz ([id number]) -------------------------------------------------------------------------------- SuperBill Details Patient Name: Gary Cruz Date of Service: 12/21/2018 Medical Record Number: [id number] Patient Account Number: [id number] Date of Birth/Sex: April 16, 1941 (78 y.o. M) Treating RN: Harold Barban Primary Care Provider: Vincente Liberty Other Clinician: Referring  Provider: Vincente Liberty Treating Provider/Extender: Melburn Hake, HOYT Weeks in Treatment: 1 Diagnosis Coding ICD-10 Codes Code Description I87.2 Venous insufficiency (chronic) (peripheral) E11.622 Type 2 diabetes mellitus with other skin ulcer L97.822 Non-pressure chronic ulcer of other part of left lower leg with fat layer exposed N18.3 Chronic kidney disease, stage 3 (moderate) I10 Essential (primary) hypertension Facility Procedures CPT4 Code: [id number] Description: [phone] - WOUND CARE VISIT-LEV 2 EST PT Modifier: Quantity: 1 Physician Procedures CPT4 Code Description: [id number] - WC PHYS LEVEL 4 - EST PT ICD-10 Diagnosis Description I87.2 Venous insufficiency (chronic) (peripheral) E11.622 Type 2 diabetes mellitus with other skin ulcer L97.822 Non-pressure chronic ulcer of other part of left  lower leg wit N18.3 Chronic kidney disease, stage 3 (moderate) Modifier: h fat layer expos Quantity: 1 ed Electronic Signature(s) Signed: 12/22/2018 2:10:29 PM By: Worthy Keeler PA-C Entered By: Worthy Keeler on 12/22/2018 09:49:33

## 2018-12-28 ENCOUNTER — Encounter: Payer: Medicare Other | Admitting: Physician Assistant

## 2018-12-28 ENCOUNTER — Other Ambulatory Visit: Payer: Self-pay

## 2018-12-28 DIAGNOSIS — I872 Venous insufficiency (chronic) (peripheral): Secondary | ICD-10-CM | POA: Diagnosis not present

## 2018-12-28 DIAGNOSIS — N183 Chronic kidney disease, stage 3 (moderate): Secondary | ICD-10-CM | POA: Diagnosis not present

## 2018-12-28 DIAGNOSIS — E1122 Type 2 diabetes mellitus with diabetic chronic kidney disease: Secondary | ICD-10-CM | POA: Diagnosis not present

## 2018-12-28 DIAGNOSIS — E119 Type 2 diabetes mellitus without complications: Secondary | ICD-10-CM | POA: Diagnosis not present

## 2018-12-28 DIAGNOSIS — L97822 Non-pressure chronic ulcer of other part of left lower leg with fat layer exposed: Secondary | ICD-10-CM | POA: Diagnosis not present

## 2018-12-28 DIAGNOSIS — I129 Hypertensive chronic kidney disease with stage 1 through stage 4 chronic kidney disease, or unspecified chronic kidney disease: Secondary | ICD-10-CM | POA: Diagnosis not present

## 2018-12-28 DIAGNOSIS — E11622 Type 2 diabetes mellitus with other skin ulcer: Secondary | ICD-10-CM | POA: Diagnosis not present

## 2018-12-31 NOTE — Progress Notes (Signed)
Gary Cruz, Gary Cruz ([id number]) Visit Report for 12/28/2018 Arrival Information Details Patient Name: Gary Cruz, Gary Cruz Date of Service: 12/28/2018 9:30 AM Medical Record Number: [id number] Patient Account Number: [id number] Date of Birth/Sex: Dec 06, 1940 (78 y.o. M) Treating RN: Gary Cruz Primary Care Gary Cruz: Gary Cruz Other Clinician: Referring Livier Hendel: Gary Cruz Treating Kanden Carey/Extender: Gary Hake, Gary Cruz: 2 Visit Information History Since Last Visit Added or deleted any medications: No Patient Arrived: Walker Any new allergies or adverse reactions: No Arrival Time: 09:32 Had a fall or experienced change in No Accompanied By: self activities of daily living that may affect Transfer Assistance: None risk of falls: Patient Identification Verified: Yes Signs or symptoms of abuse/neglect since last visito No Secondary Verification Process Completed: Yes Hospitalized since last visit: No Patient Has Alerts: Yes Implantable device outside of the clinic excluding No Patient Alerts: DMII cellular tissue based products placed in the center since last visit: Has Dressing in Place as Prescribed: Yes Pain Present Now: Yes Electronic Signature(s) Signed: 12/28/2018 4:01:59 PM By: Gary Cruz RCP, RRT, CHT Entered By: Gary Cruz on 12/28/2018 09:33:39 Gary Cruz ([id number]) -------------------------------------------------------------------------------- Clinic Level of Care Assessment Details Patient Name: Gary Cruz Date of Service: 12/28/2018 9:30 AM Medical Record Number: [id number] Patient Account Number: [id number] Date of Birth/Sex: 01-02-1941 (78 y.o. M) Treating RN: Gary Cruz Primary Care Shalese Strahan: Gary Cruz Other Clinician: Referring Ashling Roane: Gary Cruz Treating Lindley Hiney/Extender: Gary Hake, Gary Cruz: 2 Clinic Level of Care Assessment Items TOOL 4 Quantity  Score []  - Use when only an EandM is performed on FOLLOW-UP visit 0 ASSESSMENTS - Nursing Assessment / Reassessment X - Reassessment of Co-morbidities (includes updates in patient status) 1 10 X- 1 5 Reassessment of Adherence to Cruz Plan ASSESSMENTS - Wound and Skin Assessment / Reassessment X - Simple Wound Assessment / Reassessment - one wound 1 5 []  - 0 Complex Wound Assessment / Reassessment - multiple wounds []  - 0 Dermatologic / Skin Assessment (not related to wound area) ASSESSMENTS - Focused Assessment []  - Circumferential Edema Measurements - multi extremities 0 []  - 0 Nutritional Assessment / Counseling / Intervention []  - 0 Lower Extremity Assessment (monofilament, tuning fork, pulses) []  - 0 Peripheral Arterial Disease Assessment (using hand held doppler) ASSESSMENTS - Ostomy and/or Continence Assessment and Care []  - Incontinence Assessment and Management 0 []  - 0 Ostomy Care Assessment and Management (repouching, etc.) PROCESS - Coordination of Care X - Simple Patient / Family Education for ongoing care 1 15 []  - 0 Complex (extensive) Patient / Family Education for ongoing care X- 1 10 Staff obtains Programmer, systems, Records, Test Results / Process Orders []  - 0 Staff telephones HHA, Nursing Homes / Clarify orders / etc []  - 0 Routine Transfer to another Facility (non-emergent condition) []  - 0 Routine Hospital Admission (non-emergent condition) []  - 0 New Admissions / Biomedical engineer / Ordering NPWT, Apligraf, etc. []  - 0 Emergency Hospital Admission (emergent condition) X- 1 10 Simple Discharge Coordination Gary Cruz ([id number]) []  - 0 Complex (extensive) Discharge Coordination PROCESS - Special Needs []  - Pediatric / Minor Patient Management 0 []  - 0 Isolation Patient Management []  - 0 Hearing / Language / Visual special needs []  - 0 Assessment of Community assistance (transportation, D/C planning, etc.) []  - 0 Additional assistance  / Altered mentation []  - 0 Support Surface(s) Assessment (bed, cushion, seat, etc.) INTERVENTIONS - Wound Cleansing / Measurement X - Simple Wound Cleansing - one wound 1 5 []  -  0 Complex Wound Cleansing - multiple wounds X- 1 5 Wound Imaging (photographs - any number of wounds) []  - 0 Wound Tracing (instead of photographs) X- 1 5 Simple Wound Measurement - one wound []  - 0 Complex Wound Measurement - multiple wounds INTERVENTIONS - Wound Dressings X - Small Wound Dressing one or multiple wounds 1 10 []  - 0 Medium Wound Dressing one or multiple wounds []  - 0 Large Wound Dressing one or multiple wounds []  - 0 Application of Medications - topical []  - 0 Application of Medications - injection INTERVENTIONS - Miscellaneous []  - External ear exam 0 []  - 0 Specimen Collection (cultures, biopsies, blood, body fluids, etc.) []  - 0 Specimen(s) / Culture(s) sent or taken to Lab for analysis []  - 0 Patient Transfer (multiple staff / Civil Service fast streamer / Similar devices) []  - 0 Simple Staple / Suture removal (25 or less) []  - 0 Complex Staple / Suture removal (26 or more) []  - 0 Hypo / Hyperglycemic Management (close monitor of Blood Glucose) []  - 0 Ankle / Brachial Index (ABI) - do not check if billed separately X- 1 5 Vital Signs Dicola, [name] ([id number]) Has the patient been seen at the hospital within the last three years: Yes Total Score: 85 Level Of Care: New/Established - Level 3 Electronic Signature(s) Signed: 12/29/2018 9:04:30 AM By: Gary Cruz Entered By: Gary Cruz on 12/28/2018 10:30:55 Gary Cruz ([id number]) -------------------------------------------------------------------------------- Encounter Discharge Information Details Patient Name: Gary Cruz Date of Service: 12/28/2018 9:30 AM Medical Record Number: [id number] Patient Account Number: [id number] Date of Birth/Sex: 04/09/1941 (78 y.o. M) Treating RN: Gary Cruz Primary Care Gary Cruz:  Gary Cruz Other Clinician: Referring Gary Cruz: Gary Cruz Treating Gary Cruz/Extender: Gary Hake, Gary Cruz: 2 Encounter Discharge Information Items Discharge Condition: Stable Ambulatory Status: Walker Discharge Destination: Home Transportation: Private Auto Accompanied By: self Schedule Follow-up Appointment: Yes Clinical Summary of Care: Electronic Signature(s) Signed: 12/28/2018 11:56:50 AM By: Gary Cruz Entered By: Gary Cruz on 12/28/2018 10:40:51 Scaduto, Sondra Cruz ([id number]) -------------------------------------------------------------------------------- Lower Extremity Assessment Details Patient Name: Gary Cruz Date of Service: 12/28/2018 9:30 AM Medical Record Number: [id number] Patient Account Number: [id number] Date of Birth/Sex: 01/07/1941 (78 y.o. M) Treating RN: Montey Hora Primary Care Cydni Reddoch: Gary Cruz Other Clinician: Referring Lavante Toso: Gary Cruz Treating Ariabella Brien/Extender: Gary Hake, Gary Cruz: 2 Vascular Assessment Pulses: Dorsalis Pedis Palpable: [Left:Yes] Extremity colors, hair growth, and conditions: Extremity Color: [Left:Normal] Hair Growth on Extremity: [Left:No] Temperature of Extremity: [Left:Warm < 3 seconds] Toe Nail Assessment Left: Right: Thick: Yes Discolored: Yes Deformed: Yes Improper Length and Hygiene: No Electronic Signature(s) Signed: 12/28/2018 3:31:49 PM By: Montey Hora Entered By: Montey Hora on 12/28/2018 09:52:45 Kosa, Jamerson Lemmie Evens ([id number]) -------------------------------------------------------------------------------- Multi Wound Chart Details Patient Name: Gary Cruz Date of Service: 12/28/2018 9:30 AM Medical Record Number: [id number] Patient Account Number: [id number] Date of Birth/Sex: Oct 21, 1940 (78 y.o. M) Treating RN: Gary Cruz Primary Care Renaud Celli: Gary Cruz Other Clinician: Referring Khylen Riolo: Gary Cruz Treating Izela Altier/Extender: STONE III, Gary Cruz: 2 Vital Signs Height(in): 39 Pulse(bpm): 84 Weight(lbs): 194 Blood Pressure(mmHg): 113/47 Body Mass Index(BMI): 30 Temperature(F): 97.5 Respiratory Rate 16 (breaths/min): Photos: [N/A:N/A] Wound Location: Left Lower Leg - Lateral N/A N/A Wounding Event: Gradually Appeared N/A N/A Primary Etiology: Diabetic Wound/Ulcer of the N/A N/A Lower Extremity Secondary Etiology: Venous Leg Ulcer N/A N/A Comorbid History: Glaucoma, Hypertension, N/A N/A Type II Diabetes, Gout, Osteoarthritis, Neuropathy Date Acquired: 11/30/2018 N/A N/A Weeks of Cruz: 2 N/A N/A Wound  Status: Open N/A N/A Measurements L x W x D 1x1.7x0.1 N/A N/A (cm) Area (cm) : 1.335 N/A N/A Volume (cm) : 0.134 N/A N/A % Reduction in Area: 86.90% N/A N/A % Reduction in Volume: 86.80% N/A N/A Classification: Grade 2 N/A N/A Exudate Amount: Medium N/A N/A Exudate Type: Serous N/A N/A Exudate Color: amber N/A N/A Wound Margin: Flat and Intact N/A N/A Granulation Amount: Large (67-100%) N/A N/A Granulation Quality: Pink N/A N/A Necrotic Amount: Small (1-33%) N/A N/A Exposed Structures: Fat Layer (Subcutaneous N/A N/A Tissue) Exposed: Yes Fascia: No Tendon: No [place], Waino H. ([id number]) Muscle: No Joint: No Bone: No Epithelialization: Small (1-33%) N/A N/A Periwound Skin Texture: Induration: Yes N/A N/A Excoriation: No Callus: No Crepitus: No Rash: No Scarring: No Periwound Skin Moisture: Maceration: No N/A N/A Dry/Scaly: No Periwound Skin Color: Erythema: Yes N/A N/A Atrophie Blanche: No Cyanosis: No Ecchymosis: No Hemosiderin Staining: No Mottled: No Pallor: No Rubor: No Erythema Location: Circumferential N/A N/A Temperature: No Abnormality N/A N/A Tenderness on Palpation: Yes N/A N/A Wound Preparation: Ulcer Cleansing: N/A N/A Rinsed/Irrigated with Saline Topical Anesthetic Applied: Other: lidocaine  4% Cruz Notes Electronic Signature(s) Signed: 12/29/2018 9:04:30 AM By: Gary Cruz Entered By: Gary Cruz on 12/28/2018 10:29:28 Gary Cruz ([id number]) -------------------------------------------------------------------------------- Center Details Patient Name: Gary Cruz Date of Service: 12/28/2018 9:30 AM Medical Record Number: [id number] Patient Account Number: [id number] Date of Birth/Sex: 08/26/1941 (78 y.o. M) Treating RN: Gary Cruz Primary Care Horst Ostermiller: Gary Cruz Other Clinician: Referring Roshun Klingensmith: Gary Cruz Treating Jailene Cupit/Extender: Gary Hake, Gary Cruz: 2 Active Inactive Orientation to the Wound Care Program Nursing Diagnoses: Knowledge deficit related to the wound healing center program Goals: Patient/caregiver will verbalize understanding of the [facility] Program Date Initiated: 12/14/2018 Target Resolution Date: 01/09/2019 Goal Status: Active Interventions: Provide education on orientation to the wound center Notes: Wound/Skin Impairment Nursing Diagnoses: Impaired tissue integrity Goals: Ulcer/skin breakdown will have a volume reduction of 30% by week 4 Date Initiated: 12/14/2018 Target Resolution Date: 01/14/2019 Goal Status: Active Interventions: Assess patient/caregiver ability to obtain necessary supplies Assess patient/caregiver ability to perform ulcer/skin care regimen upon admission and as needed Assess ulceration(s) every visit Provide education on ulcer and skin care Notes: Electronic Signature(s) Signed: 12/29/2018 9:04:30 AM By: Gary Cruz Entered By: Gary Cruz on 12/28/2018 10:29:04 Gary Cruz ([id number]) -------------------------------------------------------------------------------- Pain Assessment Details Patient Name: Gary Cruz Date of Service: 12/28/2018 9:30 AM Medical Record Number: [id number] Patient Account Number:  [id number] Date of Birth/Sex: 08-14-1941 (78 y.o. M) Treating RN: Gary Cruz Primary Care Kenyatta Keidel: Gary Cruz Other Clinician: Referring Hali Balgobin: Gary Cruz Treating Delmy Holdren/Extender: Gary Hake, Gary Cruz: 2 Active Problems Location of Pain Severity and Description of Pain Patient Has Paino Yes Site Locations Rate the pain. Current Pain Level: 2 Pain Management and Medication Current Pain Management: Electronic Signature(s) Signed: 12/28/2018 4:01:59 PM By: Gary Cruz RCP, RRT, CHT Signed: 12/29/2018 9:04:30 AM By: Gary Cruz Entered By: Gary Cruz on 12/28/2018 09:33:51 Patin, Lauro Lemmie Evens ([id number]) -------------------------------------------------------------------------------- Patient/Caregiver Education Details Patient Name: Gary Cruz Date of Service: 12/28/2018 9:30 AM Medical Record Number: [id number] Patient Account Number: [id number] Date of Birth/Gender: 1941/07/10 (78 y.o. M) Treating RN: Gary Cruz Primary Care Physician: Gary Cruz Other Clinician: Referring Physician: Vincente Cruz Treating Physician/Extender: Sharalyn Ink in Cruz: 2 Education Assessment Education Provided To: Patient Education Topics Provided Wound/Skin Impairment: Handouts: Caring for Your Ulcer Methods: Demonstration, Explain/Verbal Responses: State content correctly Electronic Signature(s)  Signed: 12/29/2018 9:04:30 AM By: Gary Cruz Entered By: Gary Cruz on 12/28/2018 10:29:49 Herda, Sondra Cruz ([id number]) -------------------------------------------------------------------------------- Wound Assessment Details Patient Name: Gary Cruz Date of Service: 12/28/2018 9:30 AM Medical Record Number: [id number] Patient Account Number: [id number] Date of Birth/Sex: Feb 10, 1941 (78 y.o. M) Treating RN: Montey Hora Primary Care Rin Gorton: Gary Cruz Other  Clinician: Referring Akari Defelice: Gary Cruz Treating Makaria Poarch/Extender: STONE III, Gary Cruz: 2 Wound Status Wound Number: 1 Primary Diabetic Wound/Ulcer of the Lower Extremity Etiology: Wound Location: Left Lower Leg - Lateral Secondary Venous Leg Ulcer Wounding Event: Gradually Appeared Etiology: Date Acquired: 11/30/2018 Wound Status: Open Weeks Of Cruz: 2 Comorbid Glaucoma, Hypertension, Type II Diabetes, Clustered Wound: No History: Gout, Osteoarthritis, Neuropathy Photos Wound Measurements Length: (cm) 1 % Reduction i Width: (cm) 1.7 % Reduction i Depth: (cm) 0.1 Epithelializa Area: (cm) 1.335 Tunneling: Volume: (cm) 0.134 Undermining: n Area: 86.9% n Volume: 86.8% tion: Small (1-33%) No No Wound Description Classification: Grade 2 Foul Odor Aft Wound Margin: Flat and Intact Slough/Fibrin Exudate Amount: Medium Exudate Type: Serous Exudate Color: amber er Cleansing: No o Yes Wound Bed Granulation Amount: Large (67-100%) Exposed Structure Granulation Quality: Pink Fascia Exposed: No Necrotic Amount: Small (1-33%) Fat Layer (Subcutaneous Tissue) Exposed: Yes Necrotic Quality: Adherent Slough Tendon Exposed: No Muscle Exposed: No Joint Exposed: No Bone Exposed: No Periwound Skin Texture Texture Color Dise, Jagar H. ([id number]) No Abnormalities Noted: No No Abnormalities Noted: No Callus: No Atrophie Blanche: No Crepitus: No Cyanosis: No Excoriation: No Ecchymosis: No Induration: Yes Erythema: Yes Rash: No Erythema Location: Circumferential Scarring: No Hemosiderin Staining: No Mottled: No Moisture Pallor: No No Abnormalities Noted: No Rubor: No Dry / Scaly: No Maceration: No Temperature / Pain Temperature: No Abnormality Tenderness on Palpation: Yes Wound Preparation Ulcer Cleansing: Rinsed/Irrigated with Saline Topical Anesthetic Applied: Other: lidocaine 4%, Cruz Notes Wound #1 (Left, Lateral Lower  Leg) Notes silver cell, xtrasorb, conform,tubigrip Electronic Signature(s) Signed: 12/28/2018 3:31:49 PM By: Montey Hora Entered By: Montey Hora on 12/28/2018 09:50:41 Hoglund, Revan Lemmie Evens ([id number]) -------------------------------------------------------------------------------- Vitals Details Patient Name: Gary Cruz Date of Service: 12/28/2018 9:30 AM Medical Record Number: [id number] Patient Account Number: [id number] Date of Birth/Sex: 1940/10/16 (78 y.o. M) Treating RN: Gary Cruz Primary Care Doral Digangi: Gary Cruz Other Clinician: Referring Kaysey Berndt: Gary Cruz Treating Journi Moffa/Extender: Gary Hake, Gary Cruz: 2 Vital Signs Time Taken: 09:33 Temperature (F): 97.5 Height (in): 67 Pulse (bpm): 66 Weight (lbs): 194 Respiratory Rate (breaths/min): 16 Body Mass Index (BMI): 30.4 Blood Pressure (mmHg): 113/47 Reference Range: 80 - 120 mg / dl Electronic Signature(s) Signed: 12/28/2018 4:01:59 PM By: Gary Cruz RCP, RRT, CHT Entered By: Gary Cruz on 12/28/2018 09:36:25

## 2018-12-31 NOTE — Progress Notes (Signed)
KELCY, BAETEN ([id number]) Visit Report for 12/28/2018 Chief Complaint Document Details Patient Name: NIGIL, BRAMAN Date of Service: 12/28/2018 9:30 AM Medical Record Number: [id number] Patient Account Number: [id number] Date of Birth/Sex: June 16, 1941 (78 y.o. M) Treating RN: Harold Barban Primary Care Provider: Vincente Liberty Other Clinician: Referring Provider: Vincente Liberty Treating Provider/Extender: Melburn Hake, Marcell Chavarin Weeks in Treatment: 2 Information Obtained from: Patient Chief Complaint Left LE ulcer Electronic Signature(s) Signed: 12/30/2018 11:01:46 PM By: Worthy Keeler PA-C Entered By: Worthy Keeler on 12/28/2018 09:23:34 Momon, Quatavious Lemmie Evens ([id number]) -------------------------------------------------------------------------------- HPI Details Patient Name: [name] Date of Service: 12/28/2018 9:30 AM Medical Record Number: [id number] Patient Account Number: [id number] Date of Birth/Sex: 1941-02-27 (78 y.o. M) Treating RN: Harold Barban Primary Care Provider: Vincente Liberty Other Clinician: Referring Provider: Vincente Liberty Treating Provider/Extender: Melburn Hake, Alisha Bacus Weeks in Treatment: 2 History of Present Illness HPI Description: 12/14/18 on evaluation today patient actually appears to be doing somewhat poorly in regard to his left lateral leg where for the past two weeks he's had an ulcer that has been draining and causing some discomfort. He has a history of venous insufficiency, diabetes mellitus type II, chronic kidney disease stage III, and hypertension. He was noncompressible bilaterally and pulses were not palpable at this point. He actually is the patient the [facility] hospital has an appointment with nephrology on March 26. He did have a left hip replacement which he states since that time is had more drainage and swelling as far as the fluid is concerned. Nonetheless he says at this point that this has been given a lot of trouble and  he's struggling to keep things under control as far as that is concerned. It does appear that he really needs compression but again with the unknown vascular status other than the fact that we know he does not seem to have good flow I do not think that we are gonna be able to perform significant compression therapy on him at this point unfortunately. 12/21/18 on evaluation today patient actually appears to be doing better in regard to his left lateral lower Trinity ulcer. He has been tolerating the dressing changes without complication and fortunately there does not appear to be any evidence that there is infection at the site. Overall I feel like he has a lot of new skin growth things do seem to be progressing quite nicely. 12/28/18 on evaluation today patient appears to be doing excellent at this point. He is been tolerating the dressing changes without complication. Overall I feel like he's making excellent progress and has improved in regard to the size of the wound even compared to last week. We are very close to getting this to completely healed and very rapid fashion. Electronic Signature(s) Signed: 12/30/2018 11:01:46 PM By: Worthy Keeler PA-C Entered By: Worthy Keeler on 12/28/2018 10:33:29 HESSTON, HITCHENS ([id number]) -------------------------------------------------------------------------------- Physical Exam Details Patient Name: [name] Date of Service: 12/28/2018 9:30 AM Medical Record Number: [id number] Patient Account Number: [id number] Date of Birth/Sex: August 19, 1941 (78 y.o. M) Treating RN: Harold Barban Primary Care Provider: Vincente Liberty Other Clinician: Referring Provider: Vincente Liberty Treating Provider/Extender: STONE III, Yasemin Rabon Weeks in Treatment: 2 Constitutional Well-nourished and well-hydrated in no acute distress. Respiratory normal breathing without difficulty. clear to auscultation bilaterally. Cardiovascular regular rate and rhythm with  normal S1, S2. Psychiatric this patient is able to make decisions and demonstrates good insight into disease process. Alert and Oriented x 3. pleasant and cooperative. Notes 12/28/18 on evaluation  today patient appears to be doing excellent at this point. He is been tolerating the dressing changes without complication. Overall I feel like he's making excellent progress and has improved in regard to the size of the wound even compared to last week. We are very close to getting this to completely healed and very rapid fashion. Patient's wound bed currently shows signs of good granulation at this point. Fortunately there does not appear to be any evidence of infection. The patient is likewise very pleased with how things appear. Electronic Signature(s) Signed: 12/30/2018 11:01:46 PM By: Worthy Keeler PA-C Entered By: Worthy Keeler on 12/28/2018 10:34:34 Seif, [name] ([id number]) -------------------------------------------------------------------------------- Physician Orders Details Patient Name: [name] Date of Service: 12/28/2018 9:30 AM Medical Record Number: [id number] Patient Account Number: [id number] Date of Birth/Sex: 1941-05-21 (78 y.o. M) Treating RN: Harold Barban Primary Care Provider: Vincente Liberty Other Clinician: Referring Provider: Vincente Liberty Treating Provider/Extender: Melburn Hake, Julius Boniface Weeks in Treatment: 2 Verbal / Phone Orders: No Diagnosis Coding ICD-10 Coding Code Description I87.2 Venous insufficiency (chronic) (peripheral) E11.622 Type 2 diabetes mellitus with other skin ulcer L97.822 Non-pressure chronic ulcer of other part of left lower leg with fat layer exposed N18.3 Chronic kidney disease, stage 3 (moderate) I10 Essential (primary) hypertension Wound Cleansing Wound #1 Left,Lateral Lower Leg o May shower with protection. - Do not get dressing wet. If so, Please change to dry clean dressing Primary Wound Dressing Wound #1  Left,Lateral Lower Leg o Silver Alginate Secondary Dressing Wound #1 Left,Lateral Lower Leg o XtraSorb - Conform Dressing Change Frequency Wound #1 Left,Lateral Lower Leg o Other: - Change Dressing Thursday or Friday Follow-up Appointments Wound #1 Left,Lateral Lower Leg o Return Appointment in 1 week. Edema Control Wound #1 Left,Lateral Lower Leg o Other: - Tubi Grip Electronic Signature(s) Signed: 12/29/2018 9:04:30 AM By: Harold Barban Signed: 12/30/2018 11:01:46 PM By: Worthy Keeler PA-C Entered By: Harold Barban on 12/28/2018 10:32:01 RAEKWAN, SPELMAN ([id number]) -------------------------------------------------------------------------------- Problem List Details Patient Name: [name] Date of Service: 12/28/2018 9:30 AM Medical Record Number: [id number] Patient Account Number: [id number] Date of Birth/Sex: 24-Nov-1940 (78 y.o. M) Treating RN: Harold Barban Primary Care Provider: Vincente Liberty Other Clinician: Referring Provider: Vincente Liberty Treating Provider/Extender: Melburn Hake, Penelope Fittro Weeks in Treatment: 2 Active Problems ICD-10 Evaluated Encounter Code Description Active Date Today Diagnosis I87.2 Venous insufficiency (chronic) (peripheral) 12/14/2018 No Yes E11.622 Type 2 diabetes mellitus with other skin ulcer 12/14/2018 No Yes L97.822 Non-pressure chronic ulcer of other part of left lower leg with 12/14/2018 No Yes fat layer exposed N18.3 Chronic kidney disease, stage 3 (moderate) 12/14/2018 No Yes I10 Essential (primary) hypertension 12/14/2018 No Yes Inactive Problems Resolved Problems Electronic Signature(s) Signed: 12/30/2018 11:01:46 PM By: Worthy Keeler PA-C Entered By: Worthy Keeler on 12/28/2018 09:23:21 Osuch, Yee Lemmie Evens ([id number]) -------------------------------------------------------------------------------- Progress Note Details Patient Name: [name] Date of Service: 12/28/2018 9:30 AM Medical Record Number:  [id number] Patient Account Number: [id number] Date of Birth/Sex: 1941-01-09 (78 y.o. M) Treating RN: Harold Barban Primary Care Provider: Vincente Liberty Other Clinician: Referring Provider: Vincente Liberty Treating Provider/Extender: Melburn Hake, Berkeley Veldman Weeks in Treatment: 2 Subjective Chief Complaint Information obtained from Patient Left LE ulcer History of Present Illness (HPI) 12/14/18 on evaluation today patient actually appears to be doing somewhat poorly in regard to his left lateral leg where for the past two weeks he's had an ulcer that has been draining and causing some discomfort. He has a history of venous insufficiency, diabetes mellitus  type II, chronic kidney disease stage III, and hypertension. He was noncompressible bilaterally and pulses were not palpable at this point. He actually is the patient the [facility] hospital has an appointment with nephrology on March 26. He did have a left hip replacement which he states since that time is had more drainage and swelling as far as the fluid is concerned. Nonetheless he says at this point that this has been given a lot of trouble and he's struggling to keep things under control as far as that is concerned. It does appear that he really needs compression but again with the unknown vascular status other than the fact that we know he does not seem to have good flow I do not think that we are gonna be able to perform significant compression therapy on him at this point unfortunately. 12/21/18 on evaluation today patient actually appears to be doing better in regard to his left lateral lower Trinity ulcer. He has been tolerating the dressing changes without complication and fortunately there does not appear to be any evidence that there is infection at the site. Overall I feel like he has a lot of new skin growth things do seem to be progressing quite nicely. 12/28/18 on evaluation today patient appears to be doing excellent at this point. He  is been tolerating the dressing changes without complication. Overall I feel like he's making excellent progress and has improved in regard to the size of the wound even compared to last week. We are very close to getting this to completely healed and very rapid fashion. Patient History Information obtained from Patient. Family History Cancer - Father, Diabetes - Mother,Father,Siblings, Heart Disease - Mother,Father, Hypertension - Mother,Father,Siblings, No family history of Kidney Disease, Lung Disease, Seizures, Stroke, Thyroid Problems, Tuberculosis. Social History Never smoker, Marital Status - Married, Alcohol Use - Moderate, Drug Use - No History, Caffeine Use - Daily. Medical History Eyes Patient has history of Glaucoma Denies history of Cataracts, Optic Neuritis Ear/Nose/Mouth/Throat Denies history of Chronic sinus problems/congestion, Middle ear problems Hematologic/Lymphatic Denies history of Anemia, Hemophilia, Human Immunodeficiency Virus, Lymphedema, Sickle Cell Disease Respiratory Denies history of Aspiration, Asthma, Chronic Obstructive Pulmonary Disease (COPD), Pneumothorax, Sleep Apnea, Tuberculosis Cardiovascular Phifer, Cruz Lemmie Evens ([id number]) Patient has history of Hypertension Denies history of Angina, Arrhythmia, Congestive Heart Failure, Coronary Artery Disease, Deep Vein Thrombosis, Myocardial Infarction, Peripheral Arterial Disease, Peripheral Venous Disease, Phlebitis, Vasculitis Gastrointestinal Denies history of Cirrhosis , Colitis, Crohn s, Hepatitis A, Hepatitis B, Hepatitis C Endocrine Patient has history of Type II Diabetes Denies history of Type I Diabetes Genitourinary Denies history of End Stage Renal Disease Immunological Denies history of Lupus Erythematosus, Raynaud s, Scleroderma Integumentary (Skin) Denies history of History of Burn, History of pressure wounds Musculoskeletal Patient has history of Gout, Osteoarthritis Denies history of  Rheumatoid Arthritis, Osteomyelitis Neurologic Patient has history of Neuropathy - feet Denies history of Dementia, Quadriplegia, Paraplegia, Seizure Disorder Oncologic Denies history of Received Chemotherapy, Received Radiation Psychiatric Denies history of Anorexia/bulimia, Confinement Anxiety Review of Systems (ROS) Constitutional Symptoms (General Health) Denies complaints or symptoms of Fever, Chills, Marked Weight Change. Respiratory The patient has no complaints or symptoms. Cardiovascular Complains or has symptoms of LE edema. Psychiatric The patient has no complaints or symptoms. Objective Constitutional Well-nourished and well-hydrated in no acute distress. Vitals Time Taken: 9:33 AM, Height: 67 in, Weight: 194 lbs, BMI: 30.4, Temperature: 97.5 F, Pulse: 66 bpm, Respiratory Rate: 16 breaths/min, Blood Pressure: 113/47 mmHg. Respiratory normal breathing without difficulty. clear to auscultation  bilaterally. Cardiovascular regular rate and rhythm with normal S1, S2. Psychiatric Leclere, Faolan H. ([id number]) this patient is able to make decisions and demonstrates good insight into disease process. Alert and Oriented x 3. pleasant and cooperative. General Notes: 12/28/18 on evaluation today patient appears to be doing excellent at this point. He is been tolerating the dressing changes without complication. Overall I feel like he's making excellent progress and has improved in regard to the size of the wound even compared to last week. We are very close to getting this to completely healed and very rapid fashion. Patient's wound bed currently shows signs of good granulation at this point. Fortunately there does not appear to be any evidence of infection. The patient is likewise very pleased with how things appear. Integumentary (Hair, Skin) Wound #1 status is Open. Original cause of wound was Gradually Appeared. The wound is located on the Left,Lateral Lower Leg. The wound  measures 1cm length x 1.7cm width x 0.1cm depth; 1.335cm^2 area and 0.134cm^3 volume. There is Fat Layer (Subcutaneous Tissue) Exposed exposed. There is no tunneling or undermining noted. There is a medium amount of serous drainage noted. The wound margin is flat and intact. There is large (67-100%) pink granulation within the wound bed. There is a small (1-33%) amount of necrotic tissue within the wound bed including Adherent Slough. The periwound skin appearance exhibited: Induration, Erythema. The periwound skin appearance did not exhibit: Callus, Crepitus, Excoriation, Rash, Scarring, Dry/Scaly, Maceration, Atrophie Blanche, Cyanosis, Ecchymosis, Hemosiderin Staining, Mottled, Pallor, Rubor. The surrounding wound skin color is noted with erythema which is circumferential. Periwound temperature was noted as No Abnormality. The periwound has tenderness on palpation. Assessment Active Problems ICD-10 Venous insufficiency (chronic) (peripheral) Type 2 diabetes mellitus with other skin ulcer Non-pressure chronic ulcer of other part of left lower leg with fat layer exposed Chronic kidney disease, stage 3 (moderate) Essential (primary) hypertension Plan Wound Cleansing: Wound #1 Left,Lateral Lower Leg: May shower with protection. - Do not get dressing wet. If so, Please change to dry clean dressing Primary Wound Dressing: Wound #1 Left,Lateral Lower Leg: Silver Alginate Secondary Dressing: Wound #1 Left,Lateral Lower Leg: XtraSorb - Conform Dressing Change Frequency: Wound #1 Left,Lateral Lower Leg: Other: - Change Dressing Thursday or Friday Follow-up Appointments: Wound #1 Left,Lateral Lower Leg: Return Appointment in 1 week. Edema Control: KYMIR, COLES ([id number]) Wound #1 Left,Lateral Lower Leg: Other: - Tubi Grip My suggestion currently is gonna be that we continue with the above wound care measures for the next week patient is in agreement that plan. If anything changes or  worsens he let me know. Otherwise will see if it does as well as it has up to this point I expect them to likely be healed by the two week mark. Please see above for specific wound care orders. We will see patient for re-evaluation in 1 week(s) here in the clinic. If anything worsens or changes patient will contact our office for additional recommendations. Electronic Signature(s) Signed: 12/30/2018 11:01:46 PM By: Worthy Keeler PA-C Entered By: Worthy Keeler on 12/28/2018 10:35:02 KAHRON, KAUTH ([id number]) -------------------------------------------------------------------------------- ROS/PFSH Details Patient Name: [name] Date of Service: 12/28/2018 9:30 AM Medical Record Number: [id number] Patient Account Number: [id number] Date of Birth/Sex: 1941/04/02 (78 y.o. M) Treating RN: Harold Barban Primary Care Provider: Vincente Liberty Other Clinician: Referring Provider: Vincente Liberty Treating Provider/Extender: Melburn Hake, Konnar Ben Weeks in Treatment: 2 Information Obtained From Patient Wound History Do you currently have one or more open woundso Yes  How many open wounds do you currently haveo 1 Approximately how long have you had your woundso 2 weeks How have you been treating your wound(s) until nowo peroxide, soap and water Has your wound(s) ever healed and then re-openedo No Have you had any lab work done in the past montho Yes Who ordered the lab work [place] Have you tested positive for an antibiotic resistant organism (MRSA, VRE)o No Have you tested positive for osteomyelitis (bone infection)o No Have you had any tests for circulation on your legso Yes Who ordered the test[facility] Constitutional Symptoms (General Health) Complaints and Symptoms: Negative for: Fever; Chills; Marked Weight Change Cardiovascular Complaints and Symptoms: Positive for: LE edema Medical History: Positive for: Hypertension Negative for: Angina; Arrhythmia; Congestive  Heart Failure; Coronary Artery Disease; Deep Vein Thrombosis; Myocardial Infarction; Peripheral Arterial Disease; Peripheral Venous Disease; Phlebitis; Vasculitis Eyes Medical History: Positive for: Glaucoma Negative for: Cataracts; Optic Neuritis Ear/Nose/Mouth/Throat Medical History: Negative for: Chronic sinus problems/congestion; Middle ear problems Hematologic/Lymphatic Medical History: Negative for: Anemia; Hemophilia; Human Immunodeficiency Virus; Lymphedema; Sickle Cell Disease Respiratory Riddell, Forney H. ([id number]) Complaints and Symptoms: No Complaints or Symptoms Medical History: Negative for: Aspiration; Asthma; Chronic Obstructive Pulmonary Disease (COPD); Pneumothorax; Sleep Apnea; Tuberculosis Gastrointestinal Medical History: Negative for: Cirrhosis ; Colitis; Crohnos; Hepatitis A; Hepatitis B; Hepatitis C Endocrine Medical History: Positive for: Type II Diabetes Negative for: Type I Diabetes Time with diabetes: 25 years Treated with: Oral agents Blood sugar tested every day: No Genitourinary Medical History: Negative for: End Stage Renal Disease Immunological Medical History: Negative for: Lupus Erythematosus; Raynaudos; Scleroderma Integumentary (Skin) Medical History: Negative for: History of Burn; History of pressure wounds Musculoskeletal Medical History: Positive for: Gout; Osteoarthritis Negative for: Rheumatoid Arthritis; Osteomyelitis Neurologic Medical History: Positive for: Neuropathy - feet Negative for: Dementia; Quadriplegia; Paraplegia; Seizure Disorder Oncologic Medical History: Negative for: Received Chemotherapy; Received Radiation Psychiatric Complaints and Symptoms: No Complaints or Symptoms Medical HistoryVADA, YELLEN ([id number]) Negative for: Anorexia/bulimia; Confinement Anxiety HBO Extended History Items Eyes: Glaucoma Immunizations Pneumococcal Vaccine: Received Pneumococcal Vaccination: Yes Implantable  Devices None Family and Social History Cancer: Yes - Father; Diabetes: Yes - Mother,Father,Siblings; Heart Disease: Yes - Mother,Father; Hypertension: Yes - Mother,Father,Siblings; Kidney Disease: No; Lung Disease: No; Seizures: No; Stroke: No; Thyroid Problems: No; Tuberculosis: No; Never smoker; Marital Status - Married; Alcohol Use: Moderate; Drug Use: No History; Caffeine Use: Daily; Advanced Directives: No; Patient does not want information on Advanced Directives; Living Will: No; Medical Power of Attorney: No Physician Affirmation I have reviewed and agree with the above information. Electronic Signature(s) Signed: 12/29/2018 9:04:30 AM By: Harold Barban Signed: 12/30/2018 11:01:46 PM By: Worthy Keeler PA-C Entered By: Worthy Keeler on 12/28/2018 10:34:20 Revell, Dameir Lemmie Evens ([id number]) -------------------------------------------------------------------------------- SuperBill Details Patient Name: [name] Date of Service: 12/28/2018 Medical Record Number: [id number] Patient Account Number: [id number] Date of Birth/Sex: [birth year] (78 y.o. M) Treating RN: Harold Barban Primary Care Provider: Vincente Liberty Other Clinician: Referring Provider: Vincente Liberty Treating Provider/Extender: Melburn Hake, Tariah Transue Weeks in Treatment: 2 Diagnosis Coding ICD-10 Codes Code Description I87.2 Venous insufficiency (chronic) (peripheral) E11.622 Type 2 diabetes mellitus with other skin ulcer L97.822 Non-pressure chronic ulcer of other part of left lower leg with fat layer exposed N18.3 Chronic kidney disease, stage 3 (moderate) I10 Essential (primary) hypertension Facility Procedures CPT4 Code: [id number] Description: 99213 - WOUND CARE VISIT-LEV 3 EST PT Modifier: Quantity: 1 Physician Procedures CPT4 Code Description: [id number] 79024 - WC PHYS LEVEL 4 - EST PT ICD-10  Diagnosis Description I87.2 Venous insufficiency (chronic) (peripheral) E11.622 Type 2 diabetes mellitus with  other skin ulcer L97.822 Non-pressure chronic ulcer of other part of left  lower leg wit N18.3 Chronic kidney disease, stage 3 (moderate) Modifier: h fat layer expos Quantity: 1 ed Electronic Signature(s) Signed: 12/30/2018 11:01:46 PM By: Worthy Keeler PA-C Entered By: Worthy Keeler on 12/28/2018 10:35:16

## 2019-01-04 ENCOUNTER — Encounter: Payer: Medicare Other | Admitting: Physician Assistant

## 2019-01-04 ENCOUNTER — Other Ambulatory Visit: Payer: Self-pay

## 2019-01-04 DIAGNOSIS — N183 Chronic kidney disease, stage 3 (moderate): Secondary | ICD-10-CM | POA: Diagnosis not present

## 2019-01-04 DIAGNOSIS — E1122 Type 2 diabetes mellitus with diabetic chronic kidney disease: Secondary | ICD-10-CM | POA: Diagnosis not present

## 2019-01-04 DIAGNOSIS — E11622 Type 2 diabetes mellitus with other skin ulcer: Secondary | ICD-10-CM | POA: Diagnosis not present

## 2019-01-04 DIAGNOSIS — I129 Hypertensive chronic kidney disease with stage 1 through stage 4 chronic kidney disease, or unspecified chronic kidney disease: Secondary | ICD-10-CM | POA: Diagnosis not present

## 2019-01-04 DIAGNOSIS — L97822 Non-pressure chronic ulcer of other part of left lower leg with fat layer exposed: Secondary | ICD-10-CM | POA: Diagnosis not present

## 2019-01-04 DIAGNOSIS — I872 Venous insufficiency (chronic) (peripheral): Secondary | ICD-10-CM | POA: Diagnosis not present

## 2019-01-05 NOTE — Progress Notes (Signed)
JEFFERY, BACHMEIER ([id number]) Visit Report for 01/04/2019 Chief Complaint Document Details Patient Name: Gary Cruz, Gary Cruz Date of Service: 01/04/2019 9:15 AM Medical Record Number: [id number] Patient Account Number: [id number] Date of Birth/Sex: May 18, 1941 (78 y.o. M) Treating RN: Harold Barban Primary Care Provider: Vincente Liberty Other Clinician: Referring Provider: Vincente Liberty Treating Provider/Extender: Melburn Hake, Raeli Wiens Weeks in Treatment: 3 Information Obtained from: Patient Chief Complaint Left LE ulcer Electronic Signature(s) Signed: 01/05/2019 8:30:49 AM By: Worthy Keeler PA-C Entered By: Worthy Keeler on 01/04/2019 09:26:58 Gary Cruz, Gary Cruz ([id number]) -------------------------------------------------------------------------------- HPI Details Patient Name: Gary Cruz Date of Service: 01/04/2019 9:15 AM Medical Record Number: [id number] Patient Account Number: [id number] Date of Birth/Sex: 19-Jan-1941 (78 y.o. M) Treating RN: Harold Barban Primary Care Provider: Vincente Liberty Other Clinician: Referring Provider: Vincente Liberty Treating Provider/Extender: Melburn Hake, Tazia Illescas Weeks in Treatment: 3 History of Present Illness HPI Description: 12/14/18 on evaluation today patient actually appears to be doing somewhat poorly in regard to his left lateral leg where for the past two weeks he's had an ulcer that has been draining and causing some discomfort. He has a history of venous insufficiency, diabetes mellitus type II, chronic kidney disease stage III, and hypertension. He was noncompressible bilaterally and pulses were not palpable at this point. He actually is the patient the [facility] hospital has an appointment with nephrology on March 26. He did have a left hip replacement which he states since that time is had more drainage and swelling as far as the fluid is concerned. Nonetheless he says at this point that this has been given a lot of trouble and  he's struggling to keep things under control as far as that is concerned. It does appear that he really needs compression but again with the unknown vascular status other than the fact that we know he does not seem to have good flow I do not think that we are gonna be able to perform significant compression therapy on him at this point unfortunately. 12/21/18 on evaluation today patient actually appears to be doing better in regard to his left lateral lower Trinity ulcer. He has been tolerating the dressing changes without complication and fortunately there does not appear to be any evidence that there is infection at the site. Overall I feel like he has a lot of new skin growth things do seem to be progressing quite nicely. 12/28/18 on evaluation today patient appears to be doing excellent at this point. He is been tolerating the dressing changes without complication. Overall I feel like he's making excellent progress and has improved in regard to the size of the wound even compared to last week. We are very close to getting this to completely healed and very rapid fashion. 01/04/19 on evaluation today patient actually appears to be completely healed at this time. He has been tolerating the dressing changes without complication and I'm very pleased with how quickly this is progress. Electronic Signature(s) Signed: 01/05/2019 8:30:49 AM By: Worthy Keeler PA-C Entered By: Worthy Keeler on 01/04/2019 10:13:01 Gary Cruz, Gary Cruz ([id number]) -------------------------------------------------------------------------------- Physical Exam Details Patient Name: Gary Cruz Date of Service: 01/04/2019 9:15 AM Medical Record Number: [id number] Patient Account Number: [id number] Date of Birth/Sex: 11/11/1940 (78 y.o. M) Treating RN: Harold Barban Primary Care Provider: Vincente Liberty Other Clinician: Referring Provider: Vincente Liberty Treating Provider/Extender: STONE III, Camari Wisham Weeks in  Treatment: 3 Constitutional Well-nourished and well-hydrated in no acute distress. Respiratory normal breathing without difficulty. Psychiatric this patient is able to make  decisions and demonstrates good insight into disease process. Alert and Oriented x 3. pleasant and cooperative. Notes Patient's wound bed currently again show signs of complete epithelialization which is excellent news that does not appear to be any signs of active infection at this time. Overall I'm happy with how he seems to be progressing. Electronic Signature(s) Signed: 01/05/2019 8:30:49 AM By: Worthy Keeler PA-C Entered By: Worthy Keeler on 01/04/2019 10:13:25 Gary Cruz, Gary Cruz ([id number]) -------------------------------------------------------------------------------- Physician Orders Details Patient Name: Gary Cruz Date of Service: 01/04/2019 9:15 AM Medical Record Number: [id number] Patient Account Number: [id number] Date of Birth/Sex: [birth year] (78 y.o. M) Treating RN: Harold Barban Primary Care Provider: Vincente Liberty Other Clinician: Referring Provider: Vincente Liberty Treating Provider/Extender: Melburn Hake, Camilla Skeen Weeks in Treatment: 3 Verbal / Phone Orders: No Diagnosis Coding ICD-10 Coding Code Description I87.2 Venous insufficiency (chronic) (peripheral) E11.622 Type 2 diabetes mellitus with other skin ulcer L97.822 Non-pressure chronic ulcer of other part of left lower leg with fat layer exposed N18.3 Chronic kidney disease, stage 3 (moderate) I10 Essential (primary) hypertension Discharge From [facility] Services o Discharge from [place] ABD pad with Tubi Grip for the next 2 weeks to protect the new skin. Call if you have any questions. Electronic Signature(s) Signed: 01/04/2019 3:00:04 PM By: Harold Barban Signed: 01/05/2019 8:30:49 AM By: Worthy Keeler PA-C Entered By: Harold Barban on 01/04/2019 09:33:36 Gary Cruz, Gary Cruz  ([id number]) -------------------------------------------------------------------------------- Problem List Details Patient Name: Gary Cruz Date of Service: 01/04/2019 9:15 AM Medical Record Number: [id number] Patient Account Number: [id number] Date of Birth/Sex: Apr 13, 1941 (78 y.o. M) Treating RN: Harold Barban Primary Care Provider: Vincente Liberty Other Clinician: Referring Provider: Vincente Liberty Treating Provider/Extender: Melburn Hake, Kristiane Morsch Weeks in Treatment: 3 Active Problems ICD-10 Evaluated Encounter Code Description Active Date Today Diagnosis I87.2 Venous insufficiency (chronic) (peripheral) 12/14/2018 No Yes E11.622 Type 2 diabetes mellitus with other skin ulcer 12/14/2018 No Yes L97.822 Non-pressure chronic ulcer of other part of left lower leg with 12/14/2018 No Yes fat layer exposed N18.3 Chronic kidney disease, stage 3 (moderate) 12/14/2018 No Yes I10 Essential (primary) hypertension 12/14/2018 No Yes Inactive Problems Resolved Problems Electronic Signature(s) Signed: 01/05/2019 8:30:49 AM By: Worthy Keeler PA-C Entered By: Worthy Keeler on 01/04/2019 09:26:54 Gary Cruz, Gary Cruz ([id number]) -------------------------------------------------------------------------------- Progress Note Details Patient Name: Gary Cruz Date of Service: 01/04/2019 9:15 AM Medical Record Number: [id number] Patient Account Number: [id number] Date of Birth/Sex: 08-Oct-1940 (78 y.o. M) Treating RN: Harold Barban Primary Care Provider: Vincente Liberty Other Clinician: Referring Provider: Vincente Liberty Treating Provider/Extender: Melburn Hake, Athina Fahey Weeks in Treatment: 3 Subjective Chief Complaint Information obtained from Patient Left LE ulcer History of Present Illness (HPI) 12/14/18 on evaluation today patient actually appears to be doing somewhat poorly in regard to his left lateral leg where for the past two weeks he's had an ulcer that has been draining and causing some  discomfort. He has a history of venous insufficiency, diabetes mellitus type II, chronic kidney disease stage III, and hypertension. He was noncompressible bilaterally and pulses were not palpable at this point. He actually is the patient the [facility] hospital has an appointment with nephrology on March 26. He did have a left hip replacement which he states since that time is had more drainage and swelling as far as the fluid is concerned. Nonetheless he says at this point that this has been given a lot of trouble and he's struggling to keep things under control as far as that is  concerned. It does appear that he really needs compression but again with the unknown vascular status other than the fact that we know he does not seem to have good flow I do not think that we are gonna be able to perform significant compression therapy on him at this point unfortunately. 12/21/18 on evaluation today patient actually appears to be doing better in regard to his left lateral lower Trinity ulcer. He has been tolerating the dressing changes without complication and fortunately there does not appear to be any evidence that there is infection at the site. Overall I feel like he has a lot of new skin growth things do seem to be progressing quite nicely. 12/28/18 on evaluation today patient appears to be doing excellent at this point. He is been tolerating the dressing changes without complication. Overall I feel like he's making excellent progress and has improved in regard to the size of the wound even compared to last week. We are very close to getting this to completely healed and very rapid fashion. 01/04/19 on evaluation today patient actually appears to be completely healed at this time. He has been tolerating the dressing changes without complication and I'm very pleased with how quickly this is progress. Patient History Information obtained from Patient. Family History Cancer - Father, Diabetes -  Mother,Father,Siblings, Heart Disease - Mother,Father, Hypertension - Mother,Father,Siblings, No family history of Kidney Disease, Lung Disease, Seizures, Stroke, Thyroid Problems, Tuberculosis. Social History Never smoker, Marital Status - Married, Alcohol Use - Moderate, Drug Use - No History, Caffeine Use - Daily. Medical History Eyes Patient has history of Glaucoma Denies history of Cataracts, Optic Neuritis Ear/Nose/Mouth/Throat Denies history of Chronic sinus problems/congestion, Middle ear problems Hematologic/Lymphatic Denies history of Anemia, Hemophilia, Human Immunodeficiency Virus, Lymphedema, Sickle Cell Disease Respiratory Leyh, Dahlton H. ([id number]) Denies history of Aspiration, Asthma, Chronic Obstructive Pulmonary Disease (COPD), Pneumothorax, Sleep Apnea, Tuberculosis Cardiovascular Patient has history of Hypertension Denies history of Angina, Arrhythmia, Congestive Heart Failure, Coronary Artery Disease, Deep Vein Thrombosis, Myocardial Infarction, Peripheral Arterial Disease, Peripheral Venous Disease, Phlebitis, Vasculitis Gastrointestinal Denies history of Cirrhosis , Colitis, Crohn s, Hepatitis A, Hepatitis B, Hepatitis C Endocrine Patient has history of Type II Diabetes Denies history of Type I Diabetes Genitourinary Denies history of End Stage Renal Disease Immunological Denies history of Lupus Erythematosus, Raynaud s, Scleroderma Integumentary (Skin) Denies history of History of Burn, History of pressure wounds Musculoskeletal Patient has history of Gout, Osteoarthritis Denies history of Rheumatoid Arthritis, Osteomyelitis Neurologic Patient has history of Neuropathy - feet Denies history of Dementia, Quadriplegia, Paraplegia, Seizure Disorder Oncologic Denies history of Received Chemotherapy, Received Radiation Psychiatric Denies history of Anorexia/bulimia, Confinement Anxiety Review of Systems (ROS) Constitutional Symptoms (General  Health) Denies complaints or symptoms of Fever, Chills. Respiratory The patient has no complaints or symptoms. Cardiovascular Complains or has symptoms of LE edema. Psychiatric The patient has no complaints or symptoms. Objective Constitutional Well-nourished and well-hydrated in no acute distress. Vitals Time Taken: 9:04 AM, Height: 67 in, Weight: 194 lbs, BMI: 30.4, Temperature: 98.0 F, Pulse: 71 bpm, Respiratory Rate: 16 breaths/min, Blood Pressure: 127/43 mmHg. Respiratory normal breathing without difficulty. Psychiatric Gary Cruz, Gary Cruz ([id number]) this patient is able to make decisions and demonstrates good insight into disease process. Alert and Oriented x 3. pleasant and cooperative. General Notes: Patient's wound bed currently again show signs of complete epithelialization which is excellent news that does not appear to be any signs of active infection at this time. Overall I'm happy with how he seems  to be progressing. Integumentary (Hair, Skin) Wound #1 status is Healed - Epithelialized. Original cause of wound was Gradually Appeared. The wound is located on the Left,Lateral Lower Leg. The wound measures 0cm length x 0cm width x 0cm depth; 0cm^2 area and 0cm^3 volume. There is Fat Layer (Subcutaneous Tissue) Exposed exposed. There is no tunneling or undermining noted. There is a medium amount of serous drainage noted. The wound margin is flat and intact. There is large (67-100%) pink granulation within the wound bed. There is a small (1-33%) amount of necrotic tissue within the wound bed including Adherent Slough. The periwound skin appearance exhibited: Induration, Scarring, Erythema. The periwound skin appearance did not exhibit: Callus, Crepitus, Excoriation, Rash, Dry/Scaly, Maceration, Atrophie Blanche, Cyanosis, Ecchymosis, Hemosiderin Staining, Mottled, Pallor, Rubor. The surrounding wound skin color is noted with erythema which is circumferential. Periwound  temperature was noted as No Abnormality. The periwound has tenderness on palpation. Assessment Active Problems ICD-10 Venous insufficiency (chronic) (peripheral) Type 2 diabetes mellitus with other skin ulcer Non-pressure chronic ulcer of other part of left lower leg with fat layer exposed Chronic kidney disease, stage 3 (moderate) Essential (primary) hypertension Plan Discharge From [facility] Services: Discharge from [place] ABD pad with Tubi Grip for the next 2 weeks to protect the new skin. Call if you have any questions. My suggestion is that we discontinue wound care services at this point. I think a protective dressing dictatorship to help with swelling will be of benefit for him. I'm recommending and ABD pad secured with Kerlex and then the Tubigrip over top for the next one-to weeks. He's in agreement with this plan. We will subsequently see were things stand at follow-up. If anything changes or worsens meantime he will contact the office and let me know. Please see above for specific wound care orders. We will see patient for re-evaluation in 1 week(s) here in the clinic. If anything worsens or changes patient will contact our office for additional recommendations. Electronic Signature(s) Signed: 01/05/2019 8:30:49 AM By: Herbert Pun, Shykeem H. ([id number]) Entered By: Worthy Keeler on 01/04/2019 10:14:20 Gary Cruz, Gary Cruz ([id number]) -------------------------------------------------------------------------------- ROS/PFSH Details Patient Name: Gary Cruz Date of Service: 01/04/2019 9:15 AM Medical Record Number: [id number] Patient Account Number: [id number] Date of Birth/Sex: [birth year] (78 y.o. M) Treating RN: Harold Barban Primary Care Provider: Vincente Liberty Other Clinician: Referring Provider: Vincente Liberty Treating Provider/Extender: Melburn Hake, Lateisha Thurlow Weeks in Treatment: 3 Information Obtained From Patient Wound History Do you  currently have one or more open woundso Yes How many open wounds do you currently haveo 1 Approximately how long have you had your woundso 2 weeks How have you been treating your wound(s) until nowo peroxide, soap and water Has your wound(s) ever healed and then re-openedo No Have you had any lab work done in the past montho Yes Who ordered the lab work [place] Have you tested positive for an antibiotic resistant organism (MRSA, VRE)o No Have you tested positive for osteomyelitis (bone infection)o No Have you had any tests for circulation on your legso Yes Who [facility] the test[facility] Constitutional Symptoms (General Health) Complaints and Symptoms: Negative for: Fever; Chills Cardiovascular Complaints and Symptoms: Positive for: LE edema Medical History: Positive for: Hypertension Negative for: Angina; Arrhythmia; Congestive Heart Failure; Coronary Artery Disease; Deep Vein Thrombosis; Myocardial Infarction; Peripheral Arterial Disease; Peripheral Venous Disease; Phlebitis; Vasculitis Eyes Medical History: Positive for: Glaucoma Negative for: Cataracts; Optic Neuritis Ear/Nose/Mouth/Throat Medical History: Negative for: Chronic  sinus problems/congestion; Middle ear problems Hematologic/Lymphatic Medical History: Negative for: Anemia; Hemophilia; Human Immunodeficiency Virus; Lymphedema; Sickle Cell Disease Respiratory Gary Cruz, Gary H. ([id number]) Complaints and Symptoms: No Complaints or Symptoms Medical History: Negative for: Aspiration; Asthma; Chronic Obstructive Pulmonary Disease (COPD); Pneumothorax; Sleep Apnea; Tuberculosis Gastrointestinal Medical History: Negative for: Cirrhosis ; Colitis; Crohnos; Hepatitis A; Hepatitis B; Hepatitis C Endocrine Medical History: Positive for: Type II Diabetes Negative for: Type I Diabetes Time with diabetes: 25 years Treated with: Oral agents Blood sugar tested every day: No Genitourinary Medical History: Negative  for: End Stage Renal Disease Immunological Medical History: Negative for: Lupus Erythematosus; Raynaudos; Scleroderma Integumentary (Skin) Medical History: Negative for: History of Burn; History of pressure wounds Musculoskeletal Medical History: Positive for: Gout; Osteoarthritis Negative for: Rheumatoid Arthritis; Osteomyelitis Neurologic Medical History: Positive for: Neuropathy - feet Negative for: Dementia; Quadriplegia; Paraplegia; Seizure Disorder Oncologic Medical History: Negative for: Received Chemotherapy; Received Radiation Psychiatric Complaints and Symptoms: No Complaints or Symptoms Medical HistoryBRANDI, Gary Cruz ([id number]) Negative for: Anorexia/bulimia; Confinement Anxiety HBO Extended History Items Eyes: Glaucoma Immunizations Pneumococcal Vaccine: Received Pneumococcal Vaccination: Yes Implantable Devices None Family and Social History Cancer: Yes - Father; Diabetes: Yes - Mother,Father,Siblings; Heart Disease: Yes - Mother,Father; Hypertension: Yes - Mother,Father,Siblings; Kidney Disease: No; Lung Disease: No; Seizures: No; Stroke: No; Thyroid Problems: No; Tuberculosis: No; Never smoker; Marital Status - Married; Alcohol Use: Moderate; Drug Use: No History; Caffeine Use: Daily; Advanced Directives: No; Patient does not want information on Advanced Directives; Living Will: No; Medical Power of Attorney: No Physician Affirmation I have reviewed and agree with the above information. Electronic Signature(s) Signed: 01/04/2019 3:00:04 PM By: Harold Barban Signed: 01/05/2019 8:30:49 AM By: Worthy Keeler PA-C Entered By: Worthy Keeler on 01/04/2019 10:13:14 Gary Cruz, Gary Cruz ([id number]) -------------------------------------------------------------------------------- SuperBill Details Patient Name: Gary Cruz Date of Service: 01/04/2019 Medical Record Number: [id number] Patient Account Number: [id number] Date of Birth/Sex: [birth year] (78 y.o.  M) Treating RN: Harold Barban Primary Care Provider: Vincente Liberty Other Clinician: Referring Provider: Vincente Liberty Treating Provider/Extender: Melburn Hake, Eryn Marandola Weeks in Treatment: 3 Diagnosis Coding ICD-10 Codes Code Description I87.2 Venous insufficiency (chronic) (peripheral) E11.622 Type 2 diabetes mellitus with other skin ulcer L97.822 Non-pressure chronic ulcer of other part of left lower leg with fat layer exposed N18.3 Chronic kidney disease, stage 3 (moderate) I10 Essential (primary) hypertension Facility Procedures CPT4 Code: [id number] Description: [phone] - WOUND CARE VISIT-LEV 2 EST PT Modifier: Quantity: 1 Physician Procedures CPT4 Code Description: [id number] - WC PHYS LEVEL 3 - EST PT ICD-10 Diagnosis Description I87.2 Venous insufficiency (chronic) (peripheral) E11.622 Type 2 diabetes mellitus with other skin ulcer L97.822 Non-pressure chronic ulcer of other part of left  lower leg wit N18.3 Chronic kidney disease, stage 3 (moderate) Modifier: h fat layer expos Quantity: 1 ed Electronic Signature(s) Signed: 01/05/2019 8:30:49 AM By: Worthy Keeler PA-C Entered By: Worthy Keeler on 01/04/2019 10:14:32

## 2019-01-05 NOTE — Progress Notes (Signed)
Gary, Cruz ([id number]) Visit Report for 01/04/2019 Arrival Information Details Patient Name: Gary Cruz, Gary Cruz Date of Service: 01/04/2019 9:15 AM Medical Record Number: [id number] Patient Account Number: [id number] Date of Birth/Sex: [birth year] (78 y.o. M) Treating RN: Harold Barban Primary Care Hadessah Grennan: Vincente Liberty Other Clinician: Referring Worley Radermacher: Vincente Liberty Treating Geniva Lohnes/Extender: Melburn Hake, HOYT Weeks in Treatment: 3 Visit Information History Since Last Visit Added or deleted any medications: No Patient Arrived: Walker Any new allergies or adverse reactions: No Arrival Time: 09:03 Had a fall or experienced change in No Accompanied By: self activities of daily living that may affect Transfer Assistance: None risk of falls: Patient Identification Verified: Yes Signs or symptoms of abuse/neglect since last visito No Secondary Verification Process Completed: Yes Hospitalized since last visit: No Patient Has Alerts: Yes Implantable device outside of the clinic excluding No Patient Alerts: DMII cellular tissue based products placed in the center since last visit: Pain Present Now: No Electronic Signature(s) Signed: 01/04/2019 12:10:29 PM By: Lorine Bears RCP, RRT, CHT Entered By: Lorine Bears on 01/04/2019 09:04:07 Gary Cruz ([id number]) -------------------------------------------------------------------------------- Clinic Level of Care Assessment Details Patient Name: Gary Cruz Date of Service: 01/04/2019 9:15 AM Medical Record Number: [id number] Patient Account Number: [id number] Date of Birth/Sex: June 28, 1941 (78 y.o. M) Treating RN: Harold Barban Primary Care Krystn Dermody: Vincente Liberty Other Clinician: Referring Velora Horstman: Vincente Liberty Treating Damian Hofstra/Extender: Melburn Hake, HOYT Weeks in Treatment: 3 Clinic Level of Care Assessment Items TOOL 4 Quantity Score []  - Use when only an EandM is  performed on FOLLOW-UP visit 0 ASSESSMENTS - Nursing Assessment / Reassessment X - Reassessment of Co-morbidities (includes updates in patient status) 1 10 X- 1 5 Reassessment of Adherence to Treatment Plan ASSESSMENTS - Wound and Skin Assessment / Reassessment X - Simple Wound Assessment / Reassessment - one wound 1 5 []  - 0 Complex Wound Assessment / Reassessment - multiple wounds []  - 0 Dermatologic / Skin Assessment (not related to wound area) ASSESSMENTS - Focused Assessment []  - Circumferential Edema Measurements - multi extremities 0 []  - 0 Nutritional Assessment / Counseling / Intervention []  - 0 Lower Extremity Assessment (monofilament, tuning fork, pulses) []  - 0 Peripheral Arterial Disease Assessment (using hand held doppler) ASSESSMENTS - Ostomy and/or Continence Assessment and Care []  - Incontinence Assessment and Management 0 []  - 0 Ostomy Care Assessment and Management (repouching, etc.) PROCESS - Coordination of Care X - Simple Patient / Family Education for ongoing care 1 15 []  - 0 Complex (extensive) Patient / Family Education for ongoing care []  - 0 Staff obtains Programmer, systems, Records, Test Results / Process Orders []  - 0 Staff telephones HHA, Nursing Homes / Clarify orders / etc []  - 0 Routine Transfer to another Facility (non-emergent condition) []  - 0 Routine Hospital Admission (non-emergent condition) []  - 0 New Admissions / Biomedical engineer / Ordering NPWT, Apligraf, etc. []  - 0 Emergency Hospital Admission (emergent condition) X- 1 10 Simple Discharge Coordination JOHNDAVID, GERALDS. ([id number]) []  - 0 Complex (extensive) Discharge Coordination PROCESS - Special Needs []  - Pediatric / Minor Patient Management 0 []  - 0 Isolation Patient Management []  - 0 Hearing / Language / Visual special needs []  - 0 Assessment of Community assistance (transportation, D/C planning, etc.) []  - 0 Additional assistance / Altered mentation []  - 0 Support  Surface(s) Assessment (bed, cushion, seat, etc.) INTERVENTIONS - Wound Cleansing / Measurement []  - Simple Wound Cleansing - one wound 0 []  - 0 Complex Wound Cleansing - multiple wounds []  -  0 Wound Imaging (photographs - any number of wounds) []  - 0 Wound Tracing (instead of photographs) []  - 0 Simple Wound Measurement - one wound []  - 0 Complex Wound Measurement - multiple wounds INTERVENTIONS - Wound Dressings []  - Small Wound Dressing one or multiple wounds 0 []  - 0 Medium Wound Dressing one or multiple wounds []  - 0 Large Wound Dressing one or multiple wounds []  - 0 Application of Medications - topical []  - 0 Application of Medications - injection INTERVENTIONS - Miscellaneous []  - External ear exam 0 []  - 0 Specimen Collection (cultures, biopsies, blood, body fluids, etc.) []  - 0 Specimen(s) / Culture(s) sent or taken to Lab for analysis []  - 0 Patient Transfer (multiple staff / Civil Service fast streamer / Similar devices) []  - 0 Simple Staple / Suture removal (25 or less) []  - 0 Complex Staple / Suture removal (26 or more) []  - 0 Hypo / Hyperglycemic Management (close monitor of Blood Glucose) []  - 0 Ankle / Brachial Index (ABI) - do not check if billed separately X- 1 5 Vital Signs Boulet, Leam H. ([id number]) Has the patient been seen at the hospital within the last three years: Yes Total Score: 50 Level Of Care: New/Established - Level 2 Electronic Signature(s) Signed: 01/04/2019 3:00:04 PM By: Harold Barban Entered By: Harold Barban on 01/04/2019 09:32:20 Pinzon, [name] ([id number]) -------------------------------------------------------------------------------- Encounter Discharge Information Details Patient Name: Gary Cruz Date of Service: 01/04/2019 9:15 AM Medical Record Number: [id number] Patient Account Number: [id number] Date of Birth/Sex: 01-04-1941 (78 y.o. M) Treating RN: Army Melia Primary Care Shondale Quinley: Vincente Liberty Other  Clinician: Referring Jaymie Misch: Vincente Liberty Treating Libbey Duce/Extender: Melburn Hake, HOYT Weeks in Treatment: 3 Encounter Discharge Information Items Discharge Condition: Stable Ambulatory Status: Walker Discharge Destination: Home Transportation: Private Auto Accompanied By: self Schedule Follow-up Appointment: Yes Clinical Summary of Care: Electronic Signature(s) Signed: 01/04/2019 11:22:09 AM By: Army Melia Entered By: Army Melia on 01/04/2019 09:43:57 Mcnamee, Ed Lemmie Evens ([id number]) -------------------------------------------------------------------------------- Lower Extremity Assessment Details Patient Name: Gary Cruz Date of Service: 01/04/2019 9:15 AM Medical Record Number: [id number] Patient Account Number: [id number] Date of Birth/Sex: Mar 06, 1941 (78 y.o. M) Treating RN: Cornell Barman Primary Care Clayton Jarmon: Vincente Liberty Other Clinician: Referring Mozell Haber: Vincente Liberty Treating Lindsey Hommel/Extender: Melburn Hake, HOYT Weeks in Treatment: 3 Edema Assessment Assessed: [Left: No] [Right: No] Edema: [Left: N] [Right: o] Vascular Assessment Pulses: Dorsalis Pedis Palpable: [Left:Yes] Extremity colors, hair growth, and conditions: Extremity Color: [Left:Normal] Hair Growth on Extremity: [Left:No] Temperature of Extremity: [Left:Warm < 3 seconds] Toe Nail Assessment Left: Right: Thick: No Discolored: No Deformed: Yes Improper Length and Hygiene: No Electronic Signature(s) Signed: 01/04/2019 4:10:47 PM By: Gretta Cool, BSN, RN, CWS, Kim RN, BSN Entered By: Gretta Cool, BSN, RN, CWS, Kim on 01/04/2019 09:17:20 Vincent, [name] ([id number]) -------------------------------------------------------------------------------- Multi Wound Chart Details Patient Name: Gary Cruz Date of Service: 01/04/2019 9:15 AM Medical Record Number: [id number] Patient Account Number: [id number] Date of Birth/Sex: [birth year] (78 y.o. M) Treating RN: Harold Barban Primary Care Imonie Tuch:  Vincente Liberty Other Clinician: Referring Isai Gottlieb: Vincente Liberty Treating Tasheba Henson/Extender: Melburn Hake, HOYT Weeks in Treatment: 3 Vital Signs Height(in): 78 Pulse(bpm): 53 Weight(lbs): 194 Blood Pressure(mmHg): 127/43 Body Mass Index(BMI): 30 Temperature(F): 98.0 Respiratory Rate 16 (breaths/min): Photos: [N/A:N/A] Wound Location: Left Lower Leg - Lateral N/A N/A Wounding Event: Gradually Appeared N/A N/A Primary Etiology: Diabetic Wound/Ulcer of the N/A N/A Lower Extremity Secondary Etiology: Venous Leg Ulcer N/A N/A Comorbid History: Glaucoma, Hypertension, N/A N/A Type II Diabetes, Gout, Osteoarthritis, Neuropathy Date Acquired:  11/30/2018 N/A N/A Weeks of Treatment: 3 N/A N/A Wound Status: Open N/A N/A Measurements L x W x D 0.2x1x0.1 N/A N/A (cm) Area (cm) : 0.157 N/A N/A Volume (cm) : 0.016 N/A N/A % Reduction in Area: 98.50% N/A N/A % Reduction in Volume: 98.40% N/A N/A Classification: Grade 2 N/A N/A Exudate Amount: Medium N/A N/A Exudate Type: Serous N/A N/A Exudate Color: amber N/A N/A Wound Margin: Flat and Intact N/A N/A Granulation Amount: Large (67-100%) N/A N/A Granulation Quality: Pink N/A N/A Necrotic Amount: Small (1-33%) N/A N/A Exposed Structures: Fat Layer (Subcutaneous N/A N/A Tissue) Exposed: Yes Fascia: No Tendon: No Waldrip, Dmetrius H. ([id number]) Muscle: No Joint: No Bone: No Epithelialization: Small (1-33%) N/A N/A Periwound Skin Texture: Induration: Yes N/A N/A Scarring: Yes Excoriation: No Callus: No Crepitus: No Rash: No Periwound Skin Moisture: Maceration: No N/A N/A Dry/Scaly: No Periwound Skin Color: Erythema: Yes N/A N/A Atrophie Blanche: No Cyanosis: No Ecchymosis: No Hemosiderin Staining: No Mottled: No Pallor: No Rubor: No Erythema Location: Circumferential N/A N/A Temperature: No Abnormality N/A N/A Tenderness on Palpation: Yes N/A N/A Wound Preparation: Ulcer Cleansing: N/A N/A Rinsed/Irrigated  with Saline Topical Anesthetic Applied: Other: lidocaine 4% Treatment Notes Electronic Signature(s) Signed: 01/04/2019 3:00:04 PM By: Harold Barban Entered By: Harold Barban on 01/04/2019 09:30:37 CAMP, GOPAL ([id number]) -------------------------------------------------------------------------------- [place] Details Patient Name: Gary Cruz Date of Service: 01/04/2019 9:15 AM Medical Record Number: [id number] Patient Account Number: [id number] Date of Birth/Sex: 21-Aug-1941 (78 y.o. M) Treating RN: Harold Barban Primary Care Julez Huseby: Vincente Liberty Other Clinician: Referring Abbigaile Rockman: Vincente Liberty Treating Mia Winthrop/Extender: Melburn Hake, HOYT Weeks in Treatment: 3 Active Inactive Electronic Signature(s) Signed: 01/04/2019 3:00:04 PM By: Harold Barban Entered By: Harold Barban on 01/04/2019 09:34:02 Zalewski, Sahith Lemmie Evens ([id number]) -------------------------------------------------------------------------------- Pain Assessment Details Patient Name: Gary Cruz Date of Service: 01/04/2019 9:15 AM Medical Record Number: [id number] Patient Account Number: [id number] Date of Birth/Sex: [birth year] (78 y.o. M) Treating RN: Harold Barban Primary Care [name] [name]: Vincente Liberty Other Clinician: Referring Franck Vinal: Vincente Liberty Treating Laylynn Campanella/Extender: Melburn Hake, HOYT Weeks in Treatment: 3 Active Problems Location of Pain Severity and Description of Pain Patient Has Paino No Site Locations Pain Management and Medication Current Pain Management: Electronic Signature(s) Signed: 01/04/2019 12:10:29 PM By: Lorine Bears RCP, RRT, CHT Signed: 01/04/2019 3:00:04 PM By: Harold Barban Entered By: Lorine Bears on 01/04/2019 09:04:20 Gary Cruz ([id number]) -------------------------------------------------------------------------------- Patient/Caregiver Education Details Patient Name: Gary Cruz Date of Service: 01/04/2019 9:15 AM Medical Record Number: [id number] Patient Account Number: [id number] Date of Birth/Gender: 1941-07-08 (78 y.o. M) Treating RN: Harold Barban Primary Care Physician: Vincente Liberty Other Clinician: Referring Physician: Vincente Liberty Treating Physician/Extender: Sharalyn Ink in Treatment: 3 Education Assessment Education Provided To: Patient Education Topics Provided Wound/Skin Impairment: Methods: Demonstration, Explain/Verbal Responses: State content correctly Electronic Signature(s) Signed: 01/04/2019 3:00:04 PM By: Harold Barban Entered By: Harold Barban on 01/04/2019 09:31:07 Troup, [name] ([id number]) -------------------------------------------------------------------------------- Wound Assessment Details Patient Name: Gary Cruz Date of Service: 01/04/2019 9:15 AM Medical Record Number: [id number] Patient Account Number: [id number] Date of Birth/Sex: 03-29-1941 (78 y.o. M) Treating RN: Harold Barban Primary Care Ladislav Caselli: Vincente Liberty Other Clinician: Referring Niobe Dick: Vincente Liberty Treating Marcial Pless/Extender: STONE III, HOYT Weeks in Treatment: 3 Wound Status Wound Number: 1 Primary Diabetic Wound/Ulcer of the Lower Extremity Etiology: Wound Location: Left, Lateral Lower Leg Secondary Venous Leg Ulcer Wounding Event: Gradually Appeared Etiology: Date Acquired: 11/30/2018 Wound Status: Healed - Epithelialized Weeks Of Treatment: 3 Comorbid  Glaucoma, Hypertension, Type II Diabetes, Clustered Wound: No History: Gout, Osteoarthritis, Neuropathy Photos Photo Uploaded By: Gretta Cool, BSN, RN, CWS, Kim on 01/04/2019 09:18:52 Wound Measurements Length: (cm) 0 % Reducti Width: (cm) 0 % Reducti Depth: (cm) 0 Epithelia Area: (cm) 0 Tunnelin Volume: (cm) 0 Undermin on in Area: 100% on in Volume: 100% lization: Small (1-33%) g: No ing: No Wound Description Classification: Grade 2 Foul  Odor Wound Margin: Flat and Intact Slough/Fi Exudate Amount: Medium Exudate Type: Serous Exudate Color: amber After Cleansing: No brino Yes Wound Bed Granulation Amount: Large (67-100%) Exposed Structure Granulation Quality: Pink Fascia Exposed: No Necrotic Amount: Small (1-33%) Fat Layer (Subcutaneous Tissue) Exposed: Yes Necrotic Quality: Adherent Slough Tendon Exposed: No Muscle Exposed: No Joint Exposed: No Bone Exposed: No Periwound Skin Texture [place], Kaydyn H. ([id number]) Texture Color No Abnormalities Noted: No No Abnormalities Noted: No Callus: No Atrophie Blanche: No Crepitus: No Cyanosis: No Excoriation: No Ecchymosis: No Induration: Yes Erythema: Yes Rash: No Erythema Location: Circumferential Scarring: Yes Hemosiderin Staining: No Mottled: No Moisture Pallor: No No Abnormalities Noted: No Rubor: No Dry / Scaly: No Maceration: No Temperature / Pain Temperature: No Abnormality Tenderness on Palpation: Yes Wound Preparation Ulcer Cleansing: Rinsed/Irrigated with Saline Topical Anesthetic Applied: Other: lidocaine 4%, Electronic Signature(s) Signed: 01/04/2019 3:00:04 PM By: Harold Barban Entered By: Harold Barban on 01/04/2019 09:34:26 Jago, Dairon Lemmie Evens ([id number]) -------------------------------------------------------------------------------- Vitals Details Patient Name: Gary Cruz Date of Service: 01/04/2019 9:15 AM Medical Record Number: [id number] Patient Account Number: [id number] Date of Birth/Sex: Sep 08, 1941 (78 y.o. M) Treating RN: Harold Barban Primary Care Devaun Hernandez: Vincente Liberty Other Clinician: Referring Tashi Andujo: Vincente Liberty Treating Joseandres Mazer/Extender: Melburn Hake, HOYT Weeks in Treatment: 3 Vital Signs Time Taken: 09:04 Temperature (F): 98.0 Height (in): 67 Pulse (bpm): 71 Weight (lbs): 194 Respiratory Rate (breaths/min): 16 Body Mass Index (BMI): 30.4 Blood Pressure (mmHg): 127/43 Reference Range: 80 -  120 mg / dl Electronic Signature(s) Signed: 01/04/2019 12:10:29 PM By: Lorine Bears RCP, RRT, CHT Entered By: Lorine Bears on 01/04/2019 09:07:52

## 2019-07-06 DIAGNOSIS — E7800 Pure hypercholesterolemia, unspecified: Secondary | ICD-10-CM | POA: Diagnosis not present

## 2019-07-06 DIAGNOSIS — E1121 Type 2 diabetes mellitus with diabetic nephropathy: Secondary | ICD-10-CM | POA: Diagnosis not present

## 2019-07-06 DIAGNOSIS — Z96649 Presence of unspecified artificial hip joint: Secondary | ICD-10-CM | POA: Diagnosis not present

## 2019-07-06 DIAGNOSIS — I119 Hypertensive heart disease without heart failure: Secondary | ICD-10-CM | POA: Diagnosis not present

## 2019-07-06 DIAGNOSIS — R0989 Other specified symptoms and signs involving the circulatory and respiratory systems: Secondary | ICD-10-CM | POA: Diagnosis not present

## 2019-07-06 DIAGNOSIS — E559 Vitamin D deficiency, unspecified: Secondary | ICD-10-CM | POA: Diagnosis not present

## 2019-07-06 DIAGNOSIS — M5080 Other cervical disc disorders, unspecified cervical region: Secondary | ICD-10-CM | POA: Diagnosis not present

## 2019-07-06 DIAGNOSIS — M159 Polyosteoarthritis, unspecified: Secondary | ICD-10-CM | POA: Diagnosis not present

## 2019-07-06 DIAGNOSIS — Z6828 Body mass index (BMI) 28.0-28.9, adult: Secondary | ICD-10-CM | POA: Diagnosis not present

## 2019-07-06 DIAGNOSIS — K5900 Constipation, unspecified: Secondary | ICD-10-CM | POA: Diagnosis not present

## 2019-07-06 DIAGNOSIS — E1151 Type 2 diabetes mellitus with diabetic peripheral angiopathy without gangrene: Secondary | ICD-10-CM | POA: Diagnosis not present

## 2019-07-06 DIAGNOSIS — N183 Chronic kidney disease, stage 3 (moderate): Secondary | ICD-10-CM | POA: Diagnosis not present

## 2019-12-06 HISTORY — PX: PACEMAKER IMPLANT: EP1218

## 2019-12-06 HISTORY — PX: ANOMALOUS PULMONARY VENOUS RETURN REPAIR, TOTAL: SHX1156

## 2020-01-11 ENCOUNTER — Telehealth (HOSPITAL_COMMUNITY): Payer: Self-pay | Admitting: *Deleted

## 2020-01-11 NOTE — Telephone Encounter (Signed)
Received referral from Dr. Bridgett Larsson at [facility] for this pt to participate in cardiac rehab s/p TAVR on 3/25.  Called and left message for pt interest level in attending and knowledge of [place] authorization that is comprehensive and also will cover cardiac rehab. Requested call back.  Contact information provided.Cherre Huger, BSN Cardiac and Training and development officer

## 2020-01-11 NOTE — Telephone Encounter (Signed)
Pt returned my call.  Pt is interested in participating in cardiac rehab.  Pt has upcoming appt with the MD who performed his TAVR at [facility] on 5/6.  [facility].  His understanding is that once he has completed his follow up appt at [place] he will then be released back to the [place] at [facility].  Pt PCP is out of [place].  Asked pt if he had received any documentation from the [place] regarding his authorization.  Pt indicated that he had not.  Will contact the [place] at [facility] to see in his [place] authorization is comprehensive and also will cover cardiac rehab. Will also contact Dr. Silvio Pate office for copy of authorization.  Reviewed with pt general exercise guidelines for ambulation with goal of two 30 minute walks throughout the day. Pt aware of lifting and upper body movements to avoid due to PPM placement. Pt verbalized understanding. Cherre Huger, BSN Cardiac and Training and development officer

## 2020-01-17 ENCOUNTER — Telehealth (HOSPITAL_COMMUNITY): Payer: Self-pay | Admitting: Pulmonary Disease

## 2020-02-02 ENCOUNTER — Telehealth: Payer: Self-pay | Admitting: Gastroenterology

## 2020-02-02 ENCOUNTER — Telehealth (HOSPITAL_COMMUNITY): Payer: Self-pay | Admitting: Pulmonary Disease

## 2020-02-02 NOTE — Telephone Encounter (Signed)
Hi Dr. Loletha Carrow, we have received a referral from the [facility] for anemia, hematochezia and constipation. Patient recently saw a GI MD at the [place]. Records have been received and they will be sent to you for review. Please advise on scheduling. Thank you.

## 2020-02-03 NOTE — Telephone Encounter (Signed)
Next available new patient appointment.  - HD 

## 2020-02-03 NOTE — Telephone Encounter (Signed)
Left message with pt's spouse to call back to schedule.

## 2020-02-04 NOTE — Telephone Encounter (Signed)
Patient has been scheduled NP 03/09/20

## 2020-03-07 ENCOUNTER — Other Ambulatory Visit: Payer: Self-pay

## 2020-03-09 ENCOUNTER — Ambulatory Visit (INDEPENDENT_AMBULATORY_CARE_PROVIDER_SITE_OTHER): Payer: No Typology Code available for payment source | Admitting: Gastroenterology

## 2020-03-09 ENCOUNTER — Encounter: Payer: Self-pay | Admitting: Gastroenterology

## 2020-03-09 VITALS — BP 102/62 | HR 70 | Ht 67.0 in | Wt 171.1 lb

## 2020-03-09 DIAGNOSIS — D631 Anemia in chronic kidney disease: Secondary | ICD-10-CM

## 2020-03-09 DIAGNOSIS — K625 Hemorrhage of anus and rectum: Secondary | ICD-10-CM

## 2020-03-09 DIAGNOSIS — N189 Chronic kidney disease, unspecified: Secondary | ICD-10-CM | POA: Diagnosis not present

## 2020-03-09 DIAGNOSIS — K5909 Other constipation: Secondary | ICD-10-CM | POA: Diagnosis not present

## 2020-03-09 NOTE — Progress Notes (Signed)
[facility] Consult Note:  History: Gary Cruz 03/09/2020  Referring provider: Juluis Mire (provider in the GI clinic at [facility])  Reason for consult/chief complaint: Rectal bleeding  Subjective  HPI: This is a 79 year old man referred by the [facility] for constipation and rectal bleeding. The records are somewhat scattered, but a brief clinical note from April 24 visit indicates patient had developed constipation from pain medicine after cardiac surgery, and was having some intermittent rectal bleeding.  His hemoglobin was noted to a drop from 12.6 in June 20 20-10.6 (postoperative).  Iron saturation reportedly low but ferritin over 100 (the iron levels not included with the lab results at this referral). Patient reportedly told that provider he suffered a cardiac arrest during TAVR placement March this year and did not wish to have further procedures.  Provider felt that if FOBT positive, then colonoscopy should be considered.  Referred to community care since this apparently could not be provided in that [place] system. Patient also referred to nephrology for CKD and anemia. He was also advised to take colace and miralax.  (28 pages records from [place], add'l 22 pages records gleaned from [place]) _________________________  This is a pleasant 79 year old man referred by [place] with records as noted above.  He has intermittent constipation from as needed use of hydrocodone.  He had a single episode of painless rectal bleeding in January, none since then.  He denies abdominal pain, nausea, vomiting, early satiety, dysphagia or weight change.  He saw nephrology at the [facility] recently, and says he got a call that his blood work concerned them in some regard, he has some follow-up blood work and an appointment scheduled with them in the near future.  He says he "died on the table" during his TAVR and had a pacemaker placed.  He  has chronic bilateral leg ulcers that sometimes rupture and weep and [facility] dress[facility]. Since the TAVR, he says his functional status is significantly improved.  He denies chest pain, and he can now walk further without becoming short of breath.  ROS:  Review of Systems  Constitutional: Negative for appetite change and unexpected weight change.  HENT: Negative for mouth sores and voice change.   Eyes: Negative for pain and redness.  Respiratory: Negative for cough and shortness of breath.   Cardiovascular: Positive for leg swelling. Negative for chest pain and palpitations.  Genitourinary: Negative for dysuria and hematuria.  Musculoskeletal: Negative for arthralgias and myalgias.  Skin: Negative for pallor and rash.  Neurological: Negative for weakness and headaches.  Hematological: Negative for adenopathy.     Past Medical History: Past Medical History:  Diagnosis Date  . Acute kidney injury ([place])   . Anemia   . Aortic annular calcification   . Aortic valve stenosis   . CHF (congestive heart failure) ([place])   . CKD (chronic kidney disease)   . Complete heart block ([place])   . Coronary atherosclerosis   . Diabetes mellitus, type 2 ([place])   . Difficult airway for intubation   . Glaucoma   . Gout   . Hemangioma   . HTN (hypertension)   . Hyperlipemia   . Nonrheumatic aortic valve stenosis   . Renal disease      Past Surgical History: Past Surgical History:  Procedure Laterality Date  . 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 COLONOSCOPY  2011  . PACEMAKER IMPLANT  12/2019  . TOTAL HIP ARTHROPLASTY Bilateral    TAVR  Family History: Family History  Problem Relation Age of Onset  . Diabetes Mother   . Heart attack Mother   . Heart attack Father   . Heart disease Sister   . Heart attack Sister     Social History: Social History   Socioeconomic History  . Marital status: Married    Spouse name: Not on file    . Number of children: 2  . Years of education: Not on file  . Highest education level: Not on file  Occupational History  . Not on file  Tobacco Use  . Smoking status: Never Smoker  . Smokeless tobacco: Never Used  Substance and Sexual Activity  . Alcohol use: Yes    Alcohol/week: 0.0 standard drinks    Comment: beer   . Drug use: Not on file  . Sexual activity: Not on file  Other Topics Concern  . Not on file  Social History Narrative  . Not on file   Social Determinants of Health   Financial Resource Strain:   . Difficulty of Paying Living Expenses:   Food Insecurity:   . Worried About Charity fundraiser in the Last Year:   . Arboriculturist in the Last Year:   Transportation Needs:   . Film/video editor (Medical):   Marland Kitchen Lack of Transportation (Non-Medical):   Physical Activity:   . Days of Exercise per Week:   . Minutes of Exercise per Session:   Stress:   . Feeling of Stress :   Social Connections:   . Frequency of Communication with Friends and Family:   . Frequency of Social Gatherings with Friends and Family:   . Attends Religious Services:   . Active Member of Clubs or Organizations:   . Attends Archivist Meetings:   Marland Kitchen Marital Status:     Allergies: Allergies  Allergen Reactions  . Colestipol   . Niaspan [Niacin]   . Questran [Cholestyramine]     Outpatient Meds: Current Outpatient Medications  Medication Sig Dispense Refill  . aspirin 81 MG EC tablet Take 81 mg by mouth daily.     Marland Kitchen atorvastatin (LIPITOR) 80 MG tablet Take 80 mg by mouth daily.    . brimonidine (ALPHAGAN P) 0.1 % SOLN 1 drop in the morning, at noon, and at bedtime.    . carvedilol (COREG) 6.25 MG tablet Take 6.25 mg by mouth 2 (two) times daily.    Marland Kitchen glipiZIDE (GLUCOTROL) 5 MG tablet Take 5 mg by mouth daily before breakfast.    . HYDROcodone-acetaminophen (NORCO/VICODIN) 5-325 MG tablet Take 1 tablet by mouth as needed.     . Latanoprostene Bunod 0.024 % SOLN  Place 1 drop into both eyes at bedtime.    . timolol (TIMOPTIC-XR) 0.5 % ophthalmic gel-forming Place 1 drop into both eyes daily.    Marland Kitchen torsemide (DEMADEX) 10 MG tablet Take 10 mg by mouth daily.    . Vitamin D, Ergocalciferol, 50 MCG (2000 UT) CAPS Take by mouth daily.     No current facility-administered medications for this visit.      ___________________________________________________________________ Objective   Exam:  BP 102/62   Pulse 70   Ht 5\' 7"  (1.702 m)   Wt 171 lb 1.6 oz (77.6 kg)   SpO2 98%   BMI 26.80 kg/m    General: No acute distress, pleasant.  Eyes: sclera anicteric, no redness  ENT: oral mucosa moist  without lesions, no cervical or supraclavicular lymphadenopathy  CV: RRR without murmur, S1/S2, no JVD, mild peripheral edema.  Left lower leg bandaged with some clear drainage on it.  Right lower leg chronic venous stasis changes, healed ulcers.  Pacemaker left upper chest wall  Resp: clear to auscultation bilaterally, normal RR and effort noted  GI: soft, no tenderness, with active bowel sounds. No guarding or palpable organomegaly noted.+ Rectus diastasis  Skin; warm and dry, no rash or jaundice noted  Neuro: awake, alert and oriented x 3. Normal gross motor function and fluent speech Anoscopy: Somewhat limited exam due to poor patient tolerance.  No obvious distal rectal/proximal anal lesions. DRE normal external exam, no tenderness or palpable internal lesions or fissure.  Labs:  11/24/19  Hgb 11.3, Hct 35, MCV 90.Marland Kitchen...creatinine 2.75 (GFR 23) LFTs nml  01/18/20:  Creatinine 2.4  (care everywhere) Hgb 10.7   01/31/20:  Hgb 11.1 (MCV 92)  WBC 5.9  plt 130      Creatinine 2.3 (care everywhere)  Assessment: Encounter Diagnoses  Name Primary?  Marland Kitchen Anemia in chronic kidney disease, unspecified CKD stage Yes  . Chronic constipation   . Rectal bleeding     Intermittent constipation, single episode of benign anorectal bleeding months ago. Anemia  chronic kidney disease.   Plan:  I believe a colonoscopy would be greater risk than benefit for him at this point given his recent cardiac history and the probable benign nature of this bleeding. He has anemia of chronic kidney disease and needs to be followed up with nephrology and primary care at the [facility] for care of that.  It does not sound like he is lately iron deficient or need of replacement.  If he is, he should receive IV iron since the oral iron would likely worsen his constipation. Continue use of stool softener and MiraLAX for constipation. He is referred back to [place] provider.  Thank you for the courtesy of this consult.  Please call me with any questions or concerns. (60 minutes total time due to complex history and extensive records requiring review and discussion with patient)  Nelida Meuse III  CC: Referring provider noted above

## 2020-03-09 NOTE — Patient Instructions (Signed)
If you are age 78 or older, your body mass index should be between 23-30. Your Body mass index is 26.8 kg/m. If this is out of the aforementioned range listed, please consider follow up with your Primary Care Provider.  If you are age 50 or younger, your body mass index should be between 19-25. Your Body mass index is 26.8 kg/m. If this is out of the aformentioned range listed, please consider follow up with your Primary Care Provider.   It was a pleasure to see you today!  Dr. Loletha Carrow

## 2020-03-23 ENCOUNTER — Telehealth: Payer: Self-pay | Admitting: Gastroenterology

## 2020-03-23 NOTE — Telephone Encounter (Signed)
Gary Cruz from the [place] stated that pt was referred for an EGD and colonoscopy.  Pt was seen 03/09/20.  OV notes do not show that procedures were discussed.  Dr. Loletha Carrow, is this appropriate?

## 2020-03-23 NOTE — Telephone Encounter (Signed)
I did not feel the procedures were indicated.  Please refer them to my extensive consult note from that visit if they have not already reviewed it.  - HD
